# Patient Record
Sex: Male | Born: 1955 | Race: White | Marital: Married | Smoking: Former smoker
Health system: Southern US, Community
[De-identification: ages and names within clinical notes are randomized; demographics above are authoritative.]

## PROBLEM LIST (undated history)

## (undated) DIAGNOSIS — E785 Hyperlipidemia, unspecified: Secondary | ICD-10-CM

## (undated) DIAGNOSIS — G4730 Sleep apnea, unspecified: Secondary | ICD-10-CM

## (undated) DIAGNOSIS — K219 Gastro-esophageal reflux disease without esophagitis: Secondary | ICD-10-CM

## (undated) DIAGNOSIS — I10 Essential (primary) hypertension: Secondary | ICD-10-CM

## (undated) DIAGNOSIS — M1990 Unspecified osteoarthritis, unspecified site: Secondary | ICD-10-CM

## (undated) DIAGNOSIS — N179 Acute kidney failure, unspecified: Secondary | ICD-10-CM

## (undated) HISTORY — DX: Essential (primary) hypertension: I10

## (undated) HISTORY — DX: Unspecified osteoarthritis, unspecified site: M19.90

## (undated) HISTORY — DX: Hyperlipidemia, unspecified: E78.5

## (undated) HISTORY — DX: Sleep apnea, unspecified: G47.30

## (undated) HISTORY — DX: Gastro-esophageal reflux disease without esophagitis: K21.9

---
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
## 2016-05-13 DIAGNOSIS — H25093 Other age-related incipient cataract, bilateral: Secondary | ICD-10-CM | POA: Insufficient documentation

## 2016-06-06 DIAGNOSIS — M19041 Primary osteoarthritis, right hand: Secondary | ICD-10-CM | POA: Insufficient documentation

## 2016-06-06 DIAGNOSIS — M19042 Primary osteoarthritis, left hand: Secondary | ICD-10-CM

## 2016-11-03 DIAGNOSIS — L820 Inflamed seborrheic keratosis: Secondary | ICD-10-CM | POA: Diagnosis not present

## 2016-11-03 DIAGNOSIS — L905 Scar conditions and fibrosis of skin: Secondary | ICD-10-CM | POA: Diagnosis not present

## 2016-11-03 DIAGNOSIS — L821 Other seborrheic keratosis: Secondary | ICD-10-CM | POA: Diagnosis not present

## 2016-11-10 DIAGNOSIS — R05 Cough: Secondary | ICD-10-CM | POA: Diagnosis not present

## 2016-11-13 DIAGNOSIS — E785 Hyperlipidemia, unspecified: Secondary | ICD-10-CM | POA: Diagnosis not present

## 2016-11-13 DIAGNOSIS — K210 Gastro-esophageal reflux disease with esophagitis: Secondary | ICD-10-CM | POA: Diagnosis not present

## 2016-11-13 DIAGNOSIS — I10 Essential (primary) hypertension: Secondary | ICD-10-CM | POA: Diagnosis not present

## 2016-11-13 DIAGNOSIS — Z8042 Family history of malignant neoplasm of prostate: Secondary | ICD-10-CM | POA: Diagnosis not present

## 2016-11-21 DIAGNOSIS — Z6379 Other stressful life events affecting family and household: Secondary | ICD-10-CM | POA: Diagnosis not present

## 2016-11-21 DIAGNOSIS — F4323 Adjustment disorder with mixed anxiety and depressed mood: Secondary | ICD-10-CM | POA: Diagnosis not present

## 2016-12-10 DIAGNOSIS — J111 Influenza due to unidentified influenza virus with other respiratory manifestations: Secondary | ICD-10-CM | POA: Diagnosis not present

## 2016-12-23 DIAGNOSIS — R05 Cough: Secondary | ICD-10-CM | POA: Diagnosis not present

## 2016-12-23 DIAGNOSIS — G894 Chronic pain syndrome: Secondary | ICD-10-CM | POA: Diagnosis not present

## 2017-03-03 DIAGNOSIS — I7090 Unspecified atherosclerosis: Secondary | ICD-10-CM | POA: Diagnosis not present

## 2017-03-03 DIAGNOSIS — F1120 Opioid dependence, uncomplicated: Secondary | ICD-10-CM | POA: Diagnosis not present

## 2017-03-03 DIAGNOSIS — Z79899 Other long term (current) drug therapy: Secondary | ICD-10-CM | POA: Diagnosis not present

## 2017-03-03 DIAGNOSIS — K219 Gastro-esophageal reflux disease without esophagitis: Secondary | ICD-10-CM | POA: Diagnosis not present

## 2017-03-03 DIAGNOSIS — I10 Essential (primary) hypertension: Secondary | ICD-10-CM | POA: Diagnosis not present

## 2017-03-03 DIAGNOSIS — E7800 Pure hypercholesterolemia, unspecified: Secondary | ICD-10-CM | POA: Diagnosis not present

## 2017-03-03 DIAGNOSIS — Z0001 Encounter for general adult medical examination with abnormal findings: Secondary | ICD-10-CM | POA: Diagnosis not present

## 2017-03-03 DIAGNOSIS — G4733 Obstructive sleep apnea (adult) (pediatric): Secondary | ICD-10-CM | POA: Diagnosis not present

## 2017-06-03 DIAGNOSIS — G4730 Sleep apnea, unspecified: Secondary | ICD-10-CM | POA: Diagnosis not present

## 2017-06-09 DIAGNOSIS — F1120 Opioid dependence, uncomplicated: Secondary | ICD-10-CM | POA: Diagnosis not present

## 2017-06-09 DIAGNOSIS — G894 Chronic pain syndrome: Secondary | ICD-10-CM | POA: Diagnosis not present

## 2017-06-09 DIAGNOSIS — M5136 Other intervertebral disc degeneration, lumbar region: Secondary | ICD-10-CM | POA: Diagnosis not present

## 2017-06-09 DIAGNOSIS — R5383 Other fatigue: Secondary | ICD-10-CM | POA: Diagnosis not present

## 2017-06-10 DIAGNOSIS — M791 Myalgia: Secondary | ICD-10-CM | POA: Diagnosis not present

## 2017-06-10 DIAGNOSIS — H53143 Visual discomfort, bilateral: Secondary | ICD-10-CM | POA: Diagnosis not present

## 2017-06-10 DIAGNOSIS — H538 Other visual disturbances: Secondary | ICD-10-CM | POA: Diagnosis not present

## 2017-06-10 DIAGNOSIS — Z79899 Other long term (current) drug therapy: Secondary | ICD-10-CM | POA: Diagnosis not present

## 2017-06-10 DIAGNOSIS — Z7982 Long term (current) use of aspirin: Secondary | ICD-10-CM | POA: Diagnosis not present

## 2017-06-10 DIAGNOSIS — H53149 Visual discomfort, unspecified: Secondary | ICD-10-CM | POA: Diagnosis not present

## 2017-06-10 DIAGNOSIS — R011 Cardiac murmur, unspecified: Secondary | ICD-10-CM | POA: Diagnosis not present

## 2017-06-10 DIAGNOSIS — R51 Headache: Secondary | ICD-10-CM | POA: Diagnosis not present

## 2017-06-10 DIAGNOSIS — J069 Acute upper respiratory infection, unspecified: Secondary | ICD-10-CM | POA: Diagnosis not present

## 2017-06-10 DIAGNOSIS — Z87891 Personal history of nicotine dependence: Secondary | ICD-10-CM | POA: Diagnosis not present

## 2017-06-10 DIAGNOSIS — R5383 Other fatigue: Secondary | ICD-10-CM | POA: Diagnosis not present

## 2017-06-10 DIAGNOSIS — M542 Cervicalgia: Secondary | ICD-10-CM | POA: Diagnosis not present

## 2017-08-31 DIAGNOSIS — M12571 Traumatic arthropathy, right ankle and foot: Secondary | ICD-10-CM | POA: Diagnosis not present

## 2017-09-18 DIAGNOSIS — M25571 Pain in right ankle and joints of right foot: Secondary | ICD-10-CM | POA: Diagnosis not present

## 2017-10-02 DIAGNOSIS — M12571 Traumatic arthropathy, right ankle and foot: Secondary | ICD-10-CM | POA: Diagnosis not present

## 2017-10-19 DIAGNOSIS — M12571 Traumatic arthropathy, right ankle and foot: Secondary | ICD-10-CM | POA: Diagnosis not present

## 2017-10-26 DIAGNOSIS — I10 Essential (primary) hypertension: Secondary | ICD-10-CM | POA: Diagnosis not present

## 2017-10-26 DIAGNOSIS — E782 Mixed hyperlipidemia: Secondary | ICD-10-CM | POA: Diagnosis not present

## 2017-10-26 DIAGNOSIS — M79609 Pain in unspecified limb: Secondary | ICD-10-CM | POA: Diagnosis not present

## 2017-11-12 DIAGNOSIS — M12571 Traumatic arthropathy, right ankle and foot: Secondary | ICD-10-CM | POA: Diagnosis not present

## 2017-11-21 DIAGNOSIS — J069 Acute upper respiratory infection, unspecified: Secondary | ICD-10-CM | POA: Diagnosis not present

## 2017-11-21 DIAGNOSIS — R05 Cough: Secondary | ICD-10-CM | POA: Diagnosis not present

## 2017-11-23 DIAGNOSIS — Z79891 Long term (current) use of opiate analgesic: Secondary | ICD-10-CM | POA: Diagnosis not present

## 2017-11-23 DIAGNOSIS — M79673 Pain in unspecified foot: Secondary | ICD-10-CM | POA: Diagnosis not present

## 2017-11-30 DIAGNOSIS — M12571 Traumatic arthropathy, right ankle and foot: Secondary | ICD-10-CM | POA: Diagnosis not present

## 2017-11-30 DIAGNOSIS — Z01818 Encounter for other preprocedural examination: Secondary | ICD-10-CM | POA: Diagnosis not present

## 2017-12-16 DIAGNOSIS — M24571 Contracture, right ankle: Secondary | ICD-10-CM | POA: Diagnosis not present

## 2017-12-16 DIAGNOSIS — T8484XA Pain due to internal orthopedic prosthetic devices, implants and grafts, initial encounter: Secondary | ICD-10-CM | POA: Diagnosis not present

## 2017-12-16 DIAGNOSIS — G8918 Other acute postprocedural pain: Secondary | ICD-10-CM | POA: Diagnosis not present

## 2017-12-16 DIAGNOSIS — M19071 Primary osteoarthritis, right ankle and foot: Secondary | ICD-10-CM | POA: Diagnosis not present

## 2017-12-16 DIAGNOSIS — E785 Hyperlipidemia, unspecified: Secondary | ICD-10-CM | POA: Diagnosis not present

## 2017-12-16 DIAGNOSIS — Z9689 Presence of other specified functional implants: Secondary | ICD-10-CM | POA: Diagnosis not present

## 2017-12-16 DIAGNOSIS — I10 Essential (primary) hypertension: Secondary | ICD-10-CM | POA: Diagnosis not present

## 2017-12-16 DIAGNOSIS — M25571 Pain in right ankle and joints of right foot: Secondary | ICD-10-CM | POA: Diagnosis not present

## 2017-12-16 DIAGNOSIS — M12571 Traumatic arthropathy, right ankle and foot: Secondary | ICD-10-CM | POA: Diagnosis not present

## 2017-12-17 DIAGNOSIS — M24571 Contracture, right ankle: Secondary | ICD-10-CM | POA: Diagnosis not present

## 2017-12-17 DIAGNOSIS — Z9689 Presence of other specified functional implants: Secondary | ICD-10-CM | POA: Diagnosis not present

## 2017-12-17 DIAGNOSIS — M19071 Primary osteoarthritis, right ankle and foot: Secondary | ICD-10-CM | POA: Diagnosis not present

## 2017-12-31 DIAGNOSIS — T8484XA Pain due to internal orthopedic prosthetic devices, implants and grafts, initial encounter: Secondary | ICD-10-CM | POA: Diagnosis not present

## 2017-12-31 DIAGNOSIS — M12571 Traumatic arthropathy, right ankle and foot: Secondary | ICD-10-CM | POA: Diagnosis not present

## 2017-12-31 DIAGNOSIS — M24571 Contracture, right ankle: Secondary | ICD-10-CM | POA: Diagnosis not present

## 2018-01-14 DIAGNOSIS — M24571 Contracture, right ankle: Secondary | ICD-10-CM | POA: Diagnosis not present

## 2018-01-14 DIAGNOSIS — M12571 Traumatic arthropathy, right ankle and foot: Secondary | ICD-10-CM | POA: Diagnosis not present

## 2018-02-01 DIAGNOSIS — D2230 Melanocytic nevi of unspecified part of face: Secondary | ICD-10-CM | POA: Diagnosis not present

## 2018-02-01 DIAGNOSIS — K219 Gastro-esophageal reflux disease without esophagitis: Secondary | ICD-10-CM | POA: Diagnosis not present

## 2018-02-01 DIAGNOSIS — G4700 Insomnia, unspecified: Secondary | ICD-10-CM | POA: Diagnosis not present

## 2018-02-01 DIAGNOSIS — L570 Actinic keratosis: Secondary | ICD-10-CM | POA: Diagnosis not present

## 2018-02-01 DIAGNOSIS — D224 Melanocytic nevi of scalp and neck: Secondary | ICD-10-CM | POA: Diagnosis not present

## 2018-02-01 DIAGNOSIS — I10 Essential (primary) hypertension: Secondary | ICD-10-CM | POA: Diagnosis not present

## 2018-02-01 DIAGNOSIS — D225 Melanocytic nevi of trunk: Secondary | ICD-10-CM | POA: Diagnosis not present

## 2018-02-11 DIAGNOSIS — M24571 Contracture, right ankle: Secondary | ICD-10-CM | POA: Diagnosis not present

## 2018-02-11 DIAGNOSIS — M5416 Radiculopathy, lumbar region: Secondary | ICD-10-CM | POA: Diagnosis not present

## 2018-02-11 DIAGNOSIS — M12571 Traumatic arthropathy, right ankle and foot: Secondary | ICD-10-CM | POA: Diagnosis not present

## 2018-02-12 DIAGNOSIS — M25671 Stiffness of right ankle, not elsewhere classified: Secondary | ICD-10-CM | POA: Diagnosis not present

## 2018-02-12 DIAGNOSIS — M12571 Traumatic arthropathy, right ankle and foot: Secondary | ICD-10-CM | POA: Diagnosis not present

## 2018-02-15 DIAGNOSIS — Z96661 Presence of right artificial ankle joint: Secondary | ICD-10-CM | POA: Diagnosis not present

## 2018-02-15 DIAGNOSIS — M12571 Traumatic arthropathy, right ankle and foot: Secondary | ICD-10-CM | POA: Diagnosis not present

## 2018-02-18 DIAGNOSIS — M12571 Traumatic arthropathy, right ankle and foot: Secondary | ICD-10-CM | POA: Diagnosis not present

## 2018-02-18 DIAGNOSIS — R600 Localized edema: Secondary | ICD-10-CM | POA: Diagnosis not present

## 2018-02-18 DIAGNOSIS — M25671 Stiffness of right ankle, not elsewhere classified: Secondary | ICD-10-CM | POA: Diagnosis not present

## 2018-02-23 DIAGNOSIS — M12571 Traumatic arthropathy, right ankle and foot: Secondary | ICD-10-CM | POA: Diagnosis not present

## 2018-02-23 DIAGNOSIS — M25671 Stiffness of right ankle, not elsewhere classified: Secondary | ICD-10-CM | POA: Diagnosis not present

## 2018-02-23 DIAGNOSIS — R600 Localized edema: Secondary | ICD-10-CM | POA: Diagnosis not present

## 2018-02-25 DIAGNOSIS — M12571 Traumatic arthropathy, right ankle and foot: Secondary | ICD-10-CM | POA: Diagnosis not present

## 2018-02-25 DIAGNOSIS — M25671 Stiffness of right ankle, not elsewhere classified: Secondary | ICD-10-CM | POA: Diagnosis not present

## 2018-02-26 DIAGNOSIS — M25671 Stiffness of right ankle, not elsewhere classified: Secondary | ICD-10-CM | POA: Diagnosis not present

## 2018-02-26 DIAGNOSIS — R600 Localized edema: Secondary | ICD-10-CM | POA: Diagnosis not present

## 2018-02-26 DIAGNOSIS — M12571 Traumatic arthropathy, right ankle and foot: Secondary | ICD-10-CM | POA: Diagnosis not present

## 2018-03-01 DIAGNOSIS — M25671 Stiffness of right ankle, not elsewhere classified: Secondary | ICD-10-CM | POA: Diagnosis not present

## 2018-03-01 DIAGNOSIS — M12571 Traumatic arthropathy, right ankle and foot: Secondary | ICD-10-CM | POA: Diagnosis not present

## 2018-03-02 DIAGNOSIS — M12571 Traumatic arthropathy, right ankle and foot: Secondary | ICD-10-CM | POA: Diagnosis not present

## 2018-03-02 DIAGNOSIS — R600 Localized edema: Secondary | ICD-10-CM | POA: Diagnosis not present

## 2018-03-02 DIAGNOSIS — M25671 Stiffness of right ankle, not elsewhere classified: Secondary | ICD-10-CM | POA: Diagnosis not present

## 2018-03-04 DIAGNOSIS — M12571 Traumatic arthropathy, right ankle and foot: Secondary | ICD-10-CM | POA: Diagnosis not present

## 2018-03-04 DIAGNOSIS — M25671 Stiffness of right ankle, not elsewhere classified: Secondary | ICD-10-CM | POA: Diagnosis not present

## 2018-03-09 DIAGNOSIS — M12571 Traumatic arthropathy, right ankle and foot: Secondary | ICD-10-CM | POA: Diagnosis not present

## 2018-03-09 DIAGNOSIS — M25671 Stiffness of right ankle, not elsewhere classified: Secondary | ICD-10-CM | POA: Diagnosis not present

## 2018-03-13 HISTORY — PX: TOTAL ANKLE REPLACEMENT: SUR1218

## 2018-03-31 DIAGNOSIS — I10 Essential (primary) hypertension: Secondary | ICD-10-CM | POA: Diagnosis not present

## 2018-03-31 DIAGNOSIS — E782 Mixed hyperlipidemia: Secondary | ICD-10-CM | POA: Diagnosis not present

## 2018-03-31 DIAGNOSIS — G4700 Insomnia, unspecified: Secondary | ICD-10-CM | POA: Diagnosis not present

## 2018-04-13 DIAGNOSIS — M12571 Traumatic arthropathy, right ankle and foot: Secondary | ICD-10-CM | POA: Diagnosis not present

## 2018-04-13 DIAGNOSIS — M25671 Stiffness of right ankle, not elsewhere classified: Secondary | ICD-10-CM | POA: Diagnosis not present

## 2018-04-16 ENCOUNTER — Encounter: Payer: Self-pay | Admitting: Internal Medicine

## 2018-04-20 ENCOUNTER — Other Ambulatory Visit: Payer: Self-pay

## 2018-04-20 ENCOUNTER — Ambulatory Visit: Payer: BLUE CROSS/BLUE SHIELD | Admitting: Internal Medicine

## 2018-04-20 ENCOUNTER — Encounter: Payer: Self-pay | Admitting: Internal Medicine

## 2018-04-20 VITALS — BP 122/80 | HR 65 | Ht 70.0 in | Wt 247.0 lb

## 2018-04-20 DIAGNOSIS — G4733 Obstructive sleep apnea (adult) (pediatric): Secondary | ICD-10-CM | POA: Insufficient documentation

## 2018-04-20 DIAGNOSIS — Z86010 Personal history of colonic polyps: Secondary | ICD-10-CM | POA: Insufficient documentation

## 2018-04-20 DIAGNOSIS — E785 Hyperlipidemia, unspecified: Secondary | ICD-10-CM | POA: Insufficient documentation

## 2018-04-20 DIAGNOSIS — E782 Mixed hyperlipidemia: Secondary | ICD-10-CM | POA: Diagnosis not present

## 2018-04-20 DIAGNOSIS — I10 Essential (primary) hypertension: Secondary | ICD-10-CM | POA: Diagnosis not present

## 2018-04-20 DIAGNOSIS — M5030 Other cervical disc degeneration, unspecified cervical region: Secondary | ICD-10-CM | POA: Diagnosis not present

## 2018-04-20 DIAGNOSIS — Z9989 Dependence on other enabling machines and devices: Secondary | ICD-10-CM

## 2018-04-20 MED ORDER — ATORVASTATIN CALCIUM 20 MG PO TABS: 20.0000 mg | ORAL_TABLET | Freq: Every day | ORAL | 1 refills | Status: DC

## 2018-04-20 MED ORDER — LISINOPRIL 40 MG PO TABS: 40.0000 mg | ORAL_TABLET | Freq: Every day | ORAL | 1 refills | Status: DC

## 2018-04-20 MED ORDER — AMLODIPINE BESYLATE 5 MG PO TABS: 5.0000 mg | ORAL_TABLET | Freq: Every day | ORAL | 1 refills | Status: DC

## 2018-04-20 NOTE — Progress Notes (Signed)
Date:  04/20/2018   Name:  Chad Choi   DOB:  January 22, 1956   MRN:  [id number]   Chief Complaint: New Patient (Initial Visit) ([facility] Masonhil[facility]uri for meds.--seen 3 weeks ago.) Hypertension  This is a chronic problem. The problem is unchanged. The problem is controlled. Pertinent negatives include no chest pain, headaches, palpitations or shortness of breath.  Hyperlipidemia  This is a chronic problem. The problem is controlled. Associated symptoms include myalgias. Pertinent negatives include no chest pain or shortness of breath. Current antihyperlipidemic treatment includes statins.   Ankle pain - he has a service related ankle injury that was no longer responding to cortisone injections.  [facility]  He had Ankle fusion in March by [facility] and is doing well.  Lat week he had some brief chest pressure while driving lasting 15 minutes.  He was under emotional stress at the time and does not think it was angina.  He chewed 2 aspirins and pain resolved.  It has not recurred. He had a Myoview stress test in 06/2016 that was normal. He no longer smokes.  He has been having his PCP care by Dr. Shanda Howellshilikuri.   [facility].  He was there for the ankle pain.  He does not get physicals from them.  [facility] prescribes flexeril and gabapentin.  Review of Systems  Constitutional: Negative for chills, fatigue and fever.  Respiratory: Negative for cough, chest tightness, shortness of breath and wheezing.   Cardiovascular: Positive for leg swelling. Negative for chest pain and palpitations.  Gastrointestinal: Negative for blood in stool, constipation and diarrhea.  Musculoskeletal: Positive for arthralgias, back pain and myalgias.  Skin: Negative for rash.  Neurological: Negative for dizziness and headaches.  Psychiatric/Behavioral: Negative for dysphoric mood and sleep disturbance. The patient is not nervous/anxious.      Patient Active Problem List   Diagnosis Date Noted  . Degeneration of intervertebral disc of cervical region 04/20/2018  . Hyperlipidemia 04/20/2018  . Essential hypertension 04/20/2018  . Primary osteoarthritis of both hands 06/06/2016  . Other age-related incipient cataract, bilateral 05/13/2016    Prior to Admission medications   Medication Sig Start Date End Date Taking? Authorizing Provider  amLODipine (NORVASC) 5 MG tablet  10/18/13  Yes [provider]  atorvastatin (LIPITOR) 20 MG tablet  03/12/16  Yes [provider]  cyclobenzaprine (FLEXERIL) 10 MG tablet Take 10 mg by mouth 3 (three) times daily as needed for muscle spasms.   Yes [provider]  gabapentin (NEURONTIN) 300 MG capsule Take 300 mg by mouth 3 (three) times daily.   Yes [provider]  ibuprofen (ADVIL,MOTRIN) 600 MG tablet Take 600 mg by mouth every 6 (six) hours as needed.   Yes [provider]  lisinopril (PRINIVIL,ZESTRIL) 40 MG tablet  10/30/13  Yes [provider]  OxyCODONE HCl, Abuse Deter, (OXAYDO) 5 MG TABA Take 1 tablet by mouth 4 (four) times daily.   Yes [provider]    Allergies  Allergen Reactions  . Naproxen Hives  . Sulfa Antibiotics Hives and Other (See Comments)    Past Surgical History:  Procedure Laterality Date  . TOTAL ANKLE REPLACEMENT      Social History   Tobacco Use  . Smoking status: Former Games developermoker  . Smokeless tobacco: Former NeurosurgeonUser    Quit date: 1989  Substance Use Topics  . Alcohol use: Never  Frequency: Never  . Drug use: Never     Medication list has been reviewed and updated.  Current Meds  Medication Sig  . amLODipine (NORVASC) 5 MG tablet   . atorvastatin (LIPITOR) 20 MG tablet   . cyclobenzaprine (FLEXERIL) 10 MG tablet Take 10 mg by mouth 3 (three) times daily as needed for muscle spasms.  Marland Kitchen gabapentin (NEURONTIN) 300 MG capsule Take 300 mg by mouth 3 (three) times daily.  Marland Kitchen ibuprofen  (ADVIL,MOTRIN) 600 MG tablet Take 600 mg by mouth every 6 (six) hours as needed.  Marland Kitchen lisinopril (PRINIVIL,ZESTRIL) 40 MG tablet   . OxyCODONE HCl, Abuse Deter, (OXAYDO) 5 MG TABA Take 1 tablet by mouth 4 (four) times daily.    PHQ 2/9 Scores 04/20/2018  PHQ - 2 Score 0    Physical Exam  Constitutional: He is oriented to person, place, and time. He appears well-developed. No distress.  HENT:  Head: Normocephalic and atraumatic.  Neck: Normal range of motion. Neck supple.  Cardiovascular: Normal rate, regular rhythm and normal heart sounds. Exam reveals no friction rub.  No murmur heard. Pulmonary/Chest: Effort normal and breath sounds normal. No stridor. No respiratory distress. He has no wheezes.  Abdominal: Soft. Bowel sounds are normal.  Musculoskeletal: Normal range of motion. He exhibits edema (in right ankle since surgery).  Lymphadenopathy:    He has no cervical adenopathy.  Neurological: He is alert and oriented to person, place, and time.  Skin: Skin is warm and dry. No rash noted.  Psychiatric: He has a normal mood and affect. His behavior is normal. Thought content normal.  Nursing note and vitals reviewed.   BP 122/80   Pulse 65   Ht 5\' 10"  (1.778 m)   Wt 247 lb (112 kg)   SpO2 97%   BMI 35.44 kg/m   Assessment and Plan: 1. Mixed hyperlipidemia Resume statin therapy - atorvastatin (LIPITOR) 20 MG tablet; Take 1 tablet (20 mg total) by mouth daily at 6 PM.  Dispense: 90 tablet; Refill: 1  2. Essential hypertension Controlled If chest pain recurs, go to ED immediately - amLODipine (NORVASC) 5 MG tablet; Take 1 tablet (5 mg total) by mouth daily.  Dispense: 90 tablet; Refill: 1 - lisinopril (PRINIVIL,ZESTRIL) 40 MG tablet; Take 1 tablet (40 mg total) by mouth daily.  Dispense: 90 tablet; Refill: 1  3. Degeneration of intervertebral disc of cervical region Continue meds from pain management Also prescribing ambien CR  4. OSA on CPAP Continue nightly  5. Hx of  adenomatous colonic polyps Schedule 3 yr repeat with Dr. Earlean Polka   Meds ordered this encounter  Medications  . amLODipine (NORVASC) 5 MG tablet    Sig: Take 1 tablet (5 mg total) by mouth daily.    Dispense:  90 tablet    Refill:  1  . atorvastatin (LIPITOR) 20 MG tablet    Sig: Take 1 tablet (20 mg total) by mouth daily at 6 PM.    Dispense:  90 tablet    Refill:  1  . lisinopril (PRINIVIL,ZESTRIL) 40 MG tablet    Sig: Take 1 tablet (40 mg total) by mouth daily.    Dispense:  90 tablet    Refill:  1    Partially dictated using Animal nutritionist. Any errors are unintentional.  Bari Edward, MD [facility]  04/20/2018

## 2018-04-20 NOTE — Patient Instructions (Signed)
Schedule Colonoscopy  [facility] and [facility] them to me.

## 2018-04-21 DIAGNOSIS — M12571 Traumatic arthropathy, right ankle and foot: Secondary | ICD-10-CM | POA: Diagnosis not present

## 2018-04-29 DIAGNOSIS — M12571 Traumatic arthropathy, right ankle and foot: Secondary | ICD-10-CM | POA: Diagnosis not present

## 2018-04-29 DIAGNOSIS — R600 Localized edema: Secondary | ICD-10-CM | POA: Diagnosis not present

## 2018-06-07 DIAGNOSIS — G4733 Obstructive sleep apnea (adult) (pediatric): Secondary | ICD-10-CM | POA: Diagnosis not present

## 2018-06-15 DIAGNOSIS — M545 Low back pain: Secondary | ICD-10-CM | POA: Diagnosis not present

## 2018-06-26 DIAGNOSIS — M545 Low back pain: Secondary | ICD-10-CM | POA: Diagnosis not present

## 2018-06-29 DIAGNOSIS — G5730 Lesion of lateral popliteal nerve, unspecified lower limb: Secondary | ICD-10-CM | POA: Diagnosis not present

## 2018-07-02 DIAGNOSIS — G4733 Obstructive sleep apnea (adult) (pediatric): Secondary | ICD-10-CM | POA: Diagnosis not present

## 2018-07-05 DIAGNOSIS — Z9889 Other specified postprocedural states: Secondary | ICD-10-CM | POA: Insufficient documentation

## 2018-07-05 DIAGNOSIS — Z96661 Presence of right artificial ankle joint: Secondary | ICD-10-CM | POA: Diagnosis not present

## 2018-07-05 DIAGNOSIS — M25671 Stiffness of right ankle, not elsewhere classified: Secondary | ICD-10-CM | POA: Diagnosis not present

## 2018-07-07 ENCOUNTER — Encounter: Payer: Self-pay | Admitting: Internal Medicine

## 2018-09-07 ENCOUNTER — Encounter: Payer: Self-pay | Admitting: Internal Medicine

## 2018-09-07 ENCOUNTER — Ambulatory Visit (INDEPENDENT_AMBULATORY_CARE_PROVIDER_SITE_OTHER): Payer: BLUE CROSS/BLUE SHIELD | Admitting: Internal Medicine

## 2018-09-07 VITALS — BP 116/64 | HR 56 | Ht 70.0 in | Wt 257.0 lb

## 2018-09-07 DIAGNOSIS — Z23 Encounter for immunization: Secondary | ICD-10-CM

## 2018-09-07 DIAGNOSIS — G4733 Obstructive sleep apnea (adult) (pediatric): Secondary | ICD-10-CM | POA: Diagnosis not present

## 2018-09-07 DIAGNOSIS — Z86010 Personal history of colonic polyps: Secondary | ICD-10-CM

## 2018-09-07 DIAGNOSIS — E782 Mixed hyperlipidemia: Secondary | ICD-10-CM

## 2018-09-07 DIAGNOSIS — Z1159 Encounter for screening for other viral diseases: Secondary | ICD-10-CM | POA: Diagnosis not present

## 2018-09-07 DIAGNOSIS — Z125 Encounter for screening for malignant neoplasm of prostate: Secondary | ICD-10-CM

## 2018-09-07 DIAGNOSIS — Z0000 Encounter for general adult medical examination without abnormal findings: Secondary | ICD-10-CM | POA: Diagnosis not present

## 2018-09-07 DIAGNOSIS — Z9989 Dependence on other enabling machines and devices: Secondary | ICD-10-CM

## 2018-09-07 DIAGNOSIS — I10 Essential (primary) hypertension: Secondary | ICD-10-CM | POA: Diagnosis not present

## 2018-09-07 DIAGNOSIS — Z860101 Personal history of adenomatous and serrated colon polyps: Secondary | ICD-10-CM

## 2018-09-07 LAB — POCT URINALYSIS DIPSTICK
BILIRUBIN UA: NEGATIVE
Blood, UA: NEGATIVE
Glucose, UA: NEGATIVE
KETONES UA: NEGATIVE
Leukocytes, UA: NEGATIVE
Nitrite, UA: NEGATIVE
Protein, UA: NEGATIVE
Spec Grav, UA: 1.01 (ref 1.010–1.025)
UROBILINOGEN UA: 0.2 U/dL
pH, UA: 6.5 (ref 5.0–8.0)

## 2018-09-07 MED ORDER — ATORVASTATIN CALCIUM 20 MG PO TABS: 20.0000 mg | ORAL_TABLET | Freq: Every day | ORAL | 1 refills | Status: DC

## 2018-09-07 MED ORDER — AMLODIPINE BESYLATE 5 MG PO TABS: 5.0000 mg | ORAL_TABLET | Freq: Every day | ORAL | 1 refills | Status: DC

## 2018-09-07 MED ORDER — LISINOPRIL 40 MG PO TABS: 40.0000 mg | ORAL_TABLET | Freq: Every day | ORAL | 1 refills | Status: DC

## 2018-09-07 NOTE — Progress Notes (Signed)
Date:  09/07/2018   Name:  Chad BushyDouglas Rajewski   DOB:  11-12-1955   MRN:  [id number]   Chief Complaint: Annual Exam (Wants to know if we will send in Ibuprofen 600 mg tabs. [facility]. ) Chad Choi is a 62 y.o. male who presents today for his Complete Annual Exam. He feels fairly well. He reports exercising some - mostly walking 2-3 miles per day. He reports he is sleeping fairly well. He had his right ankle replaced this summer and is doing well.  Hypertension  Pertinent negatives include no chest pain, headaches, palpitations or shortness of breath.  Hyperlipidemia  Pertinent negatives include no chest pain, myalgias or shortness of breath.  Back Pain  Pertinent negatives include no abdominal pain, chest pain, dysuria or headaches.  OSA - on CPAP nightly with good sleep and no daytime somnolence or morning headache.  Review of Systems  Constitutional: Negative for appetite change, chills, diaphoresis, fatigue and unexpected weight change.  HENT: Negative for hearing loss, tinnitus, trouble swallowing and voice change.   Eyes: Negative for visual disturbance.  Respiratory: Negative for choking, shortness of breath and wheezing.   Cardiovascular: Negative for chest pain, palpitations and leg swelling.  Gastrointestinal: Negative for abdominal pain, blood in stool, constipation and diarrhea.       Gerd at times  Genitourinary: Negative for difficulty urinating, dysuria, frequency and hematuria.  Musculoskeletal: Negative for arthralgias, back pain and myalgias.  Skin: Negative for color change and rash.  Allergic/Immunologic: Negative for environmental allergies.  Neurological: Negative for dizziness, syncope and headaches.  Hematological: Negative for adenopathy.  Psychiatric/Behavioral: Negative for dysphoric mood and sleep disturbance.    Patient Active Problem List   Diagnosis Date Noted  . Severe obesity (HCC) 09/07/2018  . History of arthroplasty of right  ankle 07/05/2018  . Degeneration of intervertebral disc of cervical region 04/20/2018  . Hyperlipidemia 04/20/2018  . Essential hypertension 04/20/2018  . Hx of adenomatous colonic polyps 04/20/2018  . OSA on CPAP 04/20/2018  . Primary osteoarthritis of both hands 06/06/2016  . Other age-related incipient cataract, bilateral 05/13/2016    Allergies  Allergen Reactions  . Naproxen Hives  . Sulfa Antibiotics Hives and Other (See Comments)    Past Surgical History:  Procedure Laterality Date  . TOTAL ANKLE REPLACEMENT Right 03/2018    Social History   Tobacco Use  . Smoking status: Former Smoker    Packs/day: 1.50    Years: 16.00    Pack years: 24.00    Types: Cigarettes    Last attempt to quit: 03/28/1987    Years since quitting: 31.4  . Smokeless tobacco: Former NeurosurgeonUser    Quit date: 1989  Substance Use Topics  . Alcohol use: Never    Frequency: Never  . Drug use: Never     Medication list has been reviewed and updated.  Current Meds  Medication Sig  . amLODipine (NORVASC) 5 MG tablet Take 1 tablet (5 mg total) by mouth daily.  Marland Kitchen. atorvastatin (LIPITOR) 20 MG tablet Take 1 tablet (20 mg total) by mouth daily at 6 PM.  . cyclobenzaprine (FLEXERIL) 10 MG tablet Take 10 mg by mouth 3 (three) times daily as needed for muscle spasms. [facility]  . ibuprofen (ADVIL,MOTRIN) 600 MG tablet Take 600 mg by mouth every 6 (six) hours as needed.  Marland Kitchen. lisinopril (PRINIVIL,ZESTRIL) 40 MG tablet Take 1 tablet (40 mg total) by mouth daily.  . NON FORMULARY CPAP nightly  .  OxyCODONE HCl, Abuse Deter, (OXAYDO) 5 MG TABA Take 1 tablet by mouth 4 (four) times daily.  Marland Kitchen zolpidem (AMBIEN CR) 12.5 MG CR tablet TAKE 1 TAB BY MOUTH AT BEDTIME AS NEEDED  . [DISCONTINUED] amLODipine (NORVASC) 5 MG tablet Take 1 tablet (5 mg total) by mouth daily.  . [DISCONTINUED] atorvastatin (LIPITOR) 20 MG tablet Take 1 tablet (20 mg total) by mouth daily at 6 PM.  . [DISCONTINUED] lisinopril  (PRINIVIL,ZESTRIL) 40 MG tablet Take 1 tablet (40 mg total) by mouth daily.    PHQ 2/9 Scores 09/07/2018 04/20/2018  PHQ - 2 Score 0 0    Physical Exam  Constitutional: He is oriented to person, place, and time. He appears well-developed and well-nourished.  HENT:  Head: Normocephalic.  Right Ear: Tympanic membrane, external ear and ear canal normal.  Left Ear: Tympanic membrane, external ear and ear canal normal.  Nose: Nose normal.  Mouth/Throat: Uvula is midline and oropharynx is clear and moist.  Eyes: Pupils are equal, round, and reactive to light. Conjunctivae and EOM are normal.  Neck: Normal range of motion. Neck supple. Carotid bruit is not present. No thyromegaly present.  Cardiovascular: Normal rate, regular rhythm, normal heart sounds and intact distal pulses.  Pulmonary/Chest: Effort normal and breath sounds normal. He has no wheezes. Right breast exhibits no mass. Left breast exhibits no mass.  Abdominal: Soft. Normal appearance and bowel sounds are normal. There is no hepatosplenomegaly. There is no tenderness.  Musculoskeletal: Normal range of motion.  Lymphadenopathy:    He has no cervical adenopathy.  Neurological: He is alert and oriented to person, place, and time. He has normal reflexes.  Skin: Skin is warm, dry and intact.  Psychiatric: He has a normal mood and affect. His speech is normal and behavior is normal. Judgment and thought content normal.  Nursing note and vitals reviewed.   BP 116/64 (BP Location: Right Arm, Patient Position: Sitting, Cuff Size: Large)   Pulse (!) 56   Ht 5\' 10"  (1.778 m)   Wt 257 lb (116.6 kg)   SpO2 97%   BMI 36.88 kg/m   Assessment and Plan: 1. Annual physical exam Normal exam except for weight Work on diet and exercise - POCT urinalysis dipstick  2. Colon cancer screening Pt to call GI to schedule colonoscopy  3. Prostate cancer screening DRE deferred - PSA  4. Essential hypertension controlled - amLODipine  (NORVASC) 5 MG tablet; Take 1 tablet (5 mg total) by mouth daily.  Dispense: 90 tablet; Refill: 1 - lisinopril (PRINIVIL,ZESTRIL) 40 MG tablet; Take 1 tablet (40 mg total) by mouth daily.  Dispense: 90 tablet; Refill: 1 - CBC with Differential/Platelet - Comprehensive metabolic panel  5. Mixed hyperlipidemia On statin therapy - atorvastatin (LIPITOR) 20 MG tablet; Take 1 tablet (20 mg total) by mouth daily at 6 PM.  Dispense: 90 tablet; Refill: 1 - Lipid panel  6. Hx of adenomatous colonic polyps Pt to call GI to schedule  7. OSA on CPAP Doing well with good compliance  8. BMI 36.0-36.9,adult Work on diet and weight loss  9. Need for hepatitis C screening test - Hepatitis C antibody  10. Need for diphtheria-tetanus-pertussis (Tdap) vaccine - Tdap vaccine greater than or equal to 7yo IM   Partially dictated using Animal nutritionist. Any errors are unintentional.  Bari Edward, MD [facility]  09/07/2018

## 2018-09-07 NOTE — Patient Instructions (Addendum)
Call Dr. Earlean Polka and schedule colonoscopy  Tdap Vaccine (Tetanus, Diphtheria and Pertussis): What You Need to Know 1. Why get vaccinated? Tetanus, diphtheria and pertussis are very serious diseases. Tdap vaccine can protect [place] from these diseases. And, Tdap vaccine given to pregnant women can protect newborn babies against pertussis. TETANUS (Lockjaw) is rare in the [place] today. It causes painful muscle tightening and stiffness, usually all over the body.  It can lead to tightening of muscles in the head and neck so you can't open your mouth, swallow, or sometimes even breathe. Tetanus kills about 1 out of 10 people who are infected even after receiving the best medical care.  DIPHTHERIA is also rare in the [place] today. It can cause a thick coating to form in the back of the throat.  It can lead to breathing problems, heart failure, paralysis, and death.  PERTUSSIS (Whooping Cough) causes severe coughing spells, which can cause difficulty breathing, vomiting and disturbed sleep.  It can also lead to weight loss, incontinence, and rib fractures. Up to 2 in 100 adolescents and 5 in 100 adults with pertussis are hospitalized or have complications, which could include pneumonia or death.  These diseases are caused by bacteria. Diphtheria and pertussis are spread from person to person through secretions from coughing or sneezing. Tetanus enters the body through cuts, scratches, or wounds. Before vaccines, as many as 200,000 cases of diphtheria, 200,000 cases of pertussis, and hundreds of cases of tetanus, were reported in the [place] each year. Since vaccination began, reports of cases for tetanus and diphtheria have dropped by about 99% and for pertussis by about 80%. 2. Tdap vaccine Tdap vaccine can protect adolescents and adults from tetanus, diphtheria, and pertussis. One dose of Tdap is routinely given at age 71 or 51. People who did not get Tdap at that age should get it as  soon as possible. Tdap is especially important for healthcare professionals and anyone having close contact with a baby younger than 12 months. Pregnant women should get a dose of Tdap during every pregnancy, to protect the newborn from pertussis. Infants are most at risk for severe, life-threatening complications from pertussis. Another vaccine, called Td, protects against tetanus and diphtheria, but not pertussis. A Td booster should be given every 10 years. Tdap may be given as one of these boosters if you have never gotten Tdap before. Tdap may also be given after a severe cut or burn to prevent tetanus infection. Your doctor or the person giving you the vaccine can give you more information. Tdap may safely be given at the same time as other vaccines. 3. Some people should not get this vaccine  A person who has ever had a life-threatening allergic reaction after a previous dose of any diphtheria, tetanus or pertussis containing vaccine, OR has a severe allergy to any part of this vaccine, should not get Tdap vaccine. Tell the person giving the vaccine about any severe allergies.  Anyone who had coma or long repeated seizures within 7 days after a childhood dose of DTP or DTaP, or a previous dose of Tdap, should not get Tdap, unless a cause other than the vaccine was found. They can still get Td.  Talk to your doctor if you: ? have seizures or another nervous system problem, ? had severe pain or swelling after any vaccine containing diphtheria, tetanus or pertussis, ? ever had a condition called Guillain-Barr Syndrome (GBS), ? aren't feeling well on the day the shot is  scheduled. 4. Risks With any medicine, including vaccines, there is a chance of side effects. These are usually mild and go away on their own. Serious reactions are also possible but are rare. Most people who get Tdap vaccine do not have any problems with it. Mild problems following Tdap: (Did not interfere with  activities)  Pain where the shot was given (about 3 in 4 adolescents or 2 in 3 adults)  Redness or swelling where the shot was given (about 1 person in 5)  Mild fever of at least 100.27F (up to about 1 in 25 adolescents or 1 in 100 adults)  Headache (about 3 or 4 people in 10)  Tiredness (about 1 person in 3 or 4)  Nausea, vomiting, diarrhea, stomach ache (up to 1 in 4 adolescents or 1 in 10 adults)  Chills, sore joints (about 1 person in 10)  Body aches (about 1 person in 3 or 4)  Rash, swollen glands (uncommon)  Moderate problems following Tdap: (Interfered with activities, but did not require medical attention)  Pain where the shot was given (up to 1 in 5 or 6)  Redness or swelling where the shot was given (up to about 1 in 16 adolescents or 1 in 12 adults)  Fever over 102F (about 1 in 100 adolescents or 1 in 250 adults)  Headache (about 1 in 7 adolescents or 1 in 10 adults)  Nausea, vomiting, diarrhea, stomach ache (up to 1 or 3 people in 100)  Swelling of the entire arm where the shot was given (up to about 1 in 500).  Severe problems following Tdap: (Unable to perform usual activities; required medical attention)  Swelling, severe pain, bleeding and redness in the arm where the shot was given (rare).  Problems that could happen after any vaccine:  People sometimes faint after a medical procedure, including vaccination. Sitting or lying down for about 15 minutes can help prevent fainting, and injuries caused by a fall. Tell your doctor if you feel dizzy, or have vision changes or ringing in the ears.  Some people get severe pain in the shoulder and have difficulty moving the arm where a shot was given. This happens very rarely.  Any medication can cause a severe allergic reaction. Such reactions from a vaccine are very rare, estimated at fewer than 1 in a million doses, and would happen within a few minutes to a few hours after the vaccination. As with any  medicine, there is a very remote chance of a vaccine causing a serious injury or death. The safety of vaccines is always being monitored. For more information, visit: http://floyd.org/ 5. What if there is a serious problem? What should I look for? Look for anything that concerns you, such as signs of a severe allergic reaction, very high fever, or unusual behavior. Signs of a severe allergic reaction can include hives, swelling of the face and throat, difficulty breathing, a fast heartbeat, dizziness, and weakness. These would usually start a few minutes to a few hours after the vaccination. What should I do?  If you think it is a severe allergic reaction or other emergency that can't wait, call 9-1-1 or get the person to the nearest hospital. Otherwise, call your doctor.  Afterward, the reaction should be reported to the Vaccine Adverse Event Reporting System (VAERS). Your doctor might file this report, or you can do it yourself through the VAERS web site at www.vaers.LAgents.no, or by calling 1-812-028-4981. ? VAERS does not give medical advice. 6. The  National Vaccine Injury Compensation Program The National Vaccine Injury Compensation Program (VICP) is a federal program that was created to compensate people who may have been injured by certain vaccines. Persons who believe they may have been injured by a vaccine can learn about the program and about filing a claim by calling 1-670-427-6142 or visiting the VICP website at SpiritualWord.atwww.hrsa.gov/vaccinecompensation. There is a time limit to file a claim for compensation. 7. How can I learn more?  Ask your doctor. He or she can give you the vaccine package insert or suggest other sources of information.  Call your local or state health department.  Contact the Centers for Disease Control and Prevention (CDC): ? Call (719)373-18331-937-441-0914 (1-800-CDC-INFO) or ? Visit CDC's website at PicCapture.uywww.cdc.gov/vaccines CDC Tdap Vaccine VIS (12/06/13) This information  is not intended to replace advice given to you by your health care provider. Make sure you discuss any questions you have with your health care provider. Document Released: 03/30/2012 Document Revised: 06/19/2016 Document Reviewed: 06/19/2016 Elsevier Interactive Patient Education  2017 [employer]

## 2018-09-08 LAB — CBC WITH DIFFERENTIAL/PLATELET
BASOS ABS: 0.1 10*3/uL (ref 0.0–0.2)
Basos: 1 %
EOS (ABSOLUTE): 0.4 10*3/uL (ref 0.0–0.4)
Eos: 6 %
Hematocrit: 43.5 % (ref 37.5–51.0)
Hemoglobin: 14.4 g/dL (ref 13.0–17.7)
Immature Grans (Abs): 0 10*3/uL (ref 0.0–0.1)
Immature Granulocytes: 0 %
LYMPHS ABS: 1.6 10*3/uL (ref 0.7–3.1)
Lymphs: 23 %
MCH: 27.7 pg (ref 26.6–33.0)
MCHC: 33.1 g/dL (ref 31.5–35.7)
MCV: 84 fL (ref 79–97)
MONOS ABS: 0.8 10*3/uL (ref 0.1–0.9)
Monocytes: 12 %
Neutrophils Absolute: 3.9 10*3/uL (ref 1.4–7.0)
Neutrophils: 58 %
Platelets: 271 10*3/uL (ref 150–450)
RBC: 5.2 x10E6/uL (ref 4.14–5.80)
RDW: 13 % (ref 12.3–15.4)
WBC: 6.7 10*3/uL (ref 3.4–10.8)

## 2018-09-08 LAB — COMPREHENSIVE METABOLIC PANEL
ALK PHOS: 61 IU/L (ref 39–117)
ALT: 26 IU/L (ref 0–44)
AST: 23 IU/L (ref 0–40)
Albumin/Globulin Ratio: 1.9 (ref 1.2–2.2)
Albumin: 4.3 g/dL (ref 3.6–4.8)
BUN/Creatinine Ratio: 14 (ref 10–24)
BUN: 13 mg/dL (ref 8–27)
Bilirubin Total: 0.7 mg/dL (ref 0.0–1.2)
CO2: 23 mmol/L (ref 20–29)
Calcium: 8.9 mg/dL (ref 8.6–10.2)
Chloride: 102 mmol/L (ref 96–106)
Creatinine, Ser: 0.9 mg/dL (ref 0.76–1.27)
GFR calc Af Amer: 104 mL/min/{1.73_m2} (ref >59)
GFR calc non Af Amer: 90 mL/min/{1.73_m2} (ref >59)
GLUCOSE: 76 mg/dL (ref 65–99)
Globulin, Total: 2.3 g/dL (ref 1.5–4.5)
Potassium: 4.5 mmol/L (ref 3.5–5.2)
Sodium: 141 mmol/L (ref 134–144)
Total Protein: 6.6 g/dL (ref 6.0–8.5)

## 2018-09-08 LAB — LIPID PANEL
CHOLESTEROL TOTAL: 139 mg/dL (ref 100–199)
Chol/HDL Ratio: 3.3 ratio (ref 0.0–5.0)
HDL: 42 mg/dL (ref >39)
LDL Calculated: 74 mg/dL (ref 0–99)
TRIGLYCERIDES: 114 mg/dL (ref 0–149)
VLDL CHOLESTEROL CAL: 23 mg/dL (ref 5–40)

## 2018-09-08 LAB — HEPATITIS C ANTIBODY: Hep C Virus Ab: <0.1 s/co ratio (ref 0.0–0.9)

## 2018-09-08 LAB — PSA: Prostate Specific Ag, Serum: 0.9 ng/mL (ref 0.0–4.0)

## 2018-09-13 DIAGNOSIS — Z79899 Other long term (current) drug therapy: Secondary | ICD-10-CM | POA: Diagnosis not present

## 2018-09-13 DIAGNOSIS — M79673 Pain in unspecified foot: Secondary | ICD-10-CM | POA: Diagnosis not present

## 2018-10-15 ENCOUNTER — Encounter: Payer: Self-pay | Admitting: Internal Medicine

## 2018-10-24 ENCOUNTER — Encounter: Payer: Self-pay | Admitting: Internal Medicine

## 2018-10-25 NOTE — Telephone Encounter (Signed)
Please advise patient about adding lab.

## 2018-11-02 DIAGNOSIS — M791 Myalgia, unspecified site: Secondary | ICD-10-CM | POA: Diagnosis not present

## 2018-12-17 DIAGNOSIS — M25512 Pain in left shoulder: Secondary | ICD-10-CM | POA: Diagnosis not present

## 2018-12-17 DIAGNOSIS — M545 Low back pain: Secondary | ICD-10-CM | POA: Diagnosis not present

## 2018-12-17 DIAGNOSIS — Z23 Encounter for immunization: Secondary | ICD-10-CM | POA: Diagnosis not present

## 2018-12-17 DIAGNOSIS — I10 Essential (primary) hypertension: Secondary | ICD-10-CM | POA: Diagnosis not present

## 2018-12-17 DIAGNOSIS — R0789 Other chest pain: Secondary | ICD-10-CM | POA: Diagnosis not present

## 2018-12-17 DIAGNOSIS — E785 Hyperlipidemia, unspecified: Secondary | ICD-10-CM | POA: Diagnosis not present

## 2018-12-28 DIAGNOSIS — R079 Chest pain, unspecified: Secondary | ICD-10-CM | POA: Diagnosis not present

## 2019-01-04 DIAGNOSIS — M5416 Radiculopathy, lumbar region: Secondary | ICD-10-CM | POA: Diagnosis not present

## 2019-01-04 DIAGNOSIS — M79673 Pain in unspecified foot: Secondary | ICD-10-CM | POA: Diagnosis not present

## 2019-01-04 DIAGNOSIS — M791 Myalgia, unspecified site: Secondary | ICD-10-CM | POA: Diagnosis not present

## 2019-01-04 DIAGNOSIS — G4710 Hypersomnia, unspecified: Secondary | ICD-10-CM | POA: Diagnosis not present

## 2019-01-04 DIAGNOSIS — G4733 Obstructive sleep apnea (adult) (pediatric): Secondary | ICD-10-CM | POA: Diagnosis not present

## 2019-01-12 DIAGNOSIS — K5730 Diverticulosis of large intestine without perforation or abscess without bleeding: Secondary | ICD-10-CM | POA: Diagnosis not present

## 2019-01-12 DIAGNOSIS — Z79899 Other long term (current) drug therapy: Secondary | ICD-10-CM | POA: Diagnosis not present

## 2019-01-12 DIAGNOSIS — R52 Pain, unspecified: Secondary | ICD-10-CM | POA: Diagnosis not present

## 2019-01-12 DIAGNOSIS — M5489 Other dorsalgia: Secondary | ICD-10-CM | POA: Diagnosis not present

## 2019-01-12 DIAGNOSIS — Z87891 Personal history of nicotine dependence: Secondary | ICD-10-CM | POA: Diagnosis not present

## 2019-01-12 DIAGNOSIS — M545 Low back pain: Secondary | ICD-10-CM | POA: Diagnosis not present

## 2019-01-12 DIAGNOSIS — R109 Unspecified abdominal pain: Secondary | ICD-10-CM | POA: Diagnosis not present

## 2019-01-12 DIAGNOSIS — M62830 Muscle spasm of back: Secondary | ICD-10-CM | POA: Diagnosis not present

## 2019-01-12 DIAGNOSIS — I700 Atherosclerosis of aorta: Secondary | ICD-10-CM | POA: Diagnosis not present

## 2019-01-12 DIAGNOSIS — I10 Essential (primary) hypertension: Secondary | ICD-10-CM | POA: Diagnosis not present

## 2019-02-14 DIAGNOSIS — I4891 Unspecified atrial fibrillation: Secondary | ICD-10-CM | POA: Diagnosis not present

## 2019-02-14 DIAGNOSIS — I129 Hypertensive chronic kidney disease with stage 1 through stage 4 chronic kidney disease, or unspecified chronic kidney disease: Secondary | ICD-10-CM | POA: Diagnosis not present

## 2019-02-14 DIAGNOSIS — D72829 Elevated white blood cell count, unspecified: Secondary | ICD-10-CM | POA: Diagnosis not present

## 2019-02-14 DIAGNOSIS — I10 Essential (primary) hypertension: Secondary | ICD-10-CM | POA: Diagnosis not present

## 2019-02-14 DIAGNOSIS — R05 Cough: Secondary | ICD-10-CM | POA: Diagnosis not present

## 2019-02-14 DIAGNOSIS — J9601 Acute respiratory failure with hypoxia: Secondary | ICD-10-CM | POA: Diagnosis not present

## 2019-02-14 DIAGNOSIS — J181 Lobar pneumonia, unspecified organism: Secondary | ICD-10-CM | POA: Diagnosis not present

## 2019-02-14 DIAGNOSIS — Z209 Contact with and (suspected) exposure to unspecified communicable disease: Secondary | ICD-10-CM | POA: Diagnosis not present

## 2019-02-14 DIAGNOSIS — Z20828 Contact with and (suspected) exposure to other viral communicable diseases: Secondary | ICD-10-CM | POA: Diagnosis not present

## 2019-02-14 DIAGNOSIS — R0902 Hypoxemia: Secondary | ICD-10-CM | POA: Diagnosis not present

## 2019-02-14 DIAGNOSIS — E872 Acidosis: Secondary | ICD-10-CM | POA: Diagnosis not present

## 2019-02-14 DIAGNOSIS — G4733 Obstructive sleep apnea (adult) (pediatric): Secondary | ICD-10-CM | POA: Diagnosis not present

## 2019-02-14 DIAGNOSIS — N189 Chronic kidney disease, unspecified: Secondary | ICD-10-CM | POA: Diagnosis not present

## 2019-02-14 DIAGNOSIS — J440 Chronic obstructive pulmonary disease with acute lower respiratory infection: Secondary | ICD-10-CM | POA: Diagnosis not present

## 2019-02-14 DIAGNOSIS — R0781 Pleurodynia: Secondary | ICD-10-CM | POA: Diagnosis not present

## 2019-02-14 DIAGNOSIS — A419 Sepsis, unspecified organism: Secondary | ICD-10-CM | POA: Diagnosis not present

## 2019-02-14 DIAGNOSIS — R0602 Shortness of breath: Secondary | ICD-10-CM | POA: Diagnosis not present

## 2019-02-14 DIAGNOSIS — J869 Pyothorax without fistula: Secondary | ICD-10-CM | POA: Diagnosis not present

## 2019-02-14 DIAGNOSIS — R509 Fever, unspecified: Secondary | ICD-10-CM | POA: Diagnosis not present

## 2019-02-14 DIAGNOSIS — J9811 Atelectasis: Secondary | ICD-10-CM | POA: Diagnosis not present

## 2019-02-14 DIAGNOSIS — N170 Acute kidney failure with tubular necrosis: Secondary | ICD-10-CM | POA: Diagnosis not present

## 2019-02-14 DIAGNOSIS — E876 Hypokalemia: Secondary | ICD-10-CM | POA: Diagnosis not present

## 2019-02-14 DIAGNOSIS — N2589 Other disorders resulting from impaired renal tubular function: Secondary | ICD-10-CM | POA: Diagnosis not present

## 2019-02-14 DIAGNOSIS — R0689 Other abnormalities of breathing: Secondary | ICD-10-CM | POA: Diagnosis not present

## 2019-02-14 DIAGNOSIS — E871 Hypo-osmolality and hyponatremia: Secondary | ICD-10-CM | POA: Diagnosis not present

## 2019-02-14 DIAGNOSIS — I2510 Atherosclerotic heart disease of native coronary artery without angina pectoris: Secondary | ICD-10-CM | POA: Diagnosis not present

## 2019-02-14 DIAGNOSIS — J90 Pleural effusion, not elsewhere classified: Secondary | ICD-10-CM | POA: Diagnosis not present

## 2019-02-14 DIAGNOSIS — J918 Pleural effusion in other conditions classified elsewhere: Secondary | ICD-10-CM | POA: Diagnosis not present

## 2019-02-14 DIAGNOSIS — Z4682 Encounter for fitting and adjustment of non-vascular catheter: Secondary | ICD-10-CM | POA: Diagnosis not present

## 2019-02-14 DIAGNOSIS — J189 Pneumonia, unspecified organism: Secondary | ICD-10-CM | POA: Diagnosis not present

## 2019-02-14 DIAGNOSIS — R791 Abnormal coagulation profile: Secondary | ICD-10-CM | POA: Diagnosis not present

## 2019-02-14 DIAGNOSIS — J9691 Respiratory failure, unspecified with hypoxia: Secondary | ICD-10-CM | POA: Diagnosis not present

## 2019-02-14 DIAGNOSIS — N179 Acute kidney failure, unspecified: Secondary | ICD-10-CM | POA: Diagnosis not present

## 2019-02-14 DIAGNOSIS — J441 Chronic obstructive pulmonary disease with (acute) exacerbation: Secondary | ICD-10-CM | POA: Diagnosis not present

## 2019-02-14 DIAGNOSIS — Z87891 Personal history of nicotine dependence: Secondary | ICD-10-CM | POA: Diagnosis not present

## 2019-02-14 DIAGNOSIS — M545 Low back pain: Secondary | ICD-10-CM | POA: Diagnosis not present

## 2019-02-14 DIAGNOSIS — E8770 Fluid overload, unspecified: Secondary | ICD-10-CM | POA: Diagnosis not present

## 2019-02-14 DIAGNOSIS — R091 Pleurisy: Secondary | ICD-10-CM | POA: Diagnosis not present

## 2019-02-15 DIAGNOSIS — R509 Fever, unspecified: Secondary | ICD-10-CM | POA: Diagnosis not present

## 2019-02-15 DIAGNOSIS — R791 Abnormal coagulation profile: Secondary | ICD-10-CM | POA: Diagnosis not present

## 2019-02-15 DIAGNOSIS — R0781 Pleurodynia: Secondary | ICD-10-CM | POA: Diagnosis not present

## 2019-02-15 DIAGNOSIS — J189 Pneumonia, unspecified organism: Secondary | ICD-10-CM | POA: Diagnosis not present

## 2019-02-15 DIAGNOSIS — A419 Sepsis, unspecified organism: Secondary | ICD-10-CM | POA: Diagnosis not present

## 2019-02-15 DIAGNOSIS — R05 Cough: Secondary | ICD-10-CM | POA: Diagnosis not present

## 2019-02-15 DIAGNOSIS — J181 Lobar pneumonia, unspecified organism: Secondary | ICD-10-CM | POA: Diagnosis not present

## 2019-02-16 DIAGNOSIS — J189 Pneumonia, unspecified organism: Secondary | ICD-10-CM | POA: Diagnosis not present

## 2019-02-16 DIAGNOSIS — R509 Fever, unspecified: Secondary | ICD-10-CM | POA: Diagnosis not present

## 2019-02-16 DIAGNOSIS — J90 Pleural effusion, not elsewhere classified: Secondary | ICD-10-CM | POA: Diagnosis not present

## 2019-02-16 DIAGNOSIS — A419 Sepsis, unspecified organism: Secondary | ICD-10-CM | POA: Diagnosis not present

## 2019-02-17 DIAGNOSIS — J869 Pyothorax without fistula: Secondary | ICD-10-CM | POA: Diagnosis not present

## 2019-02-17 DIAGNOSIS — A419 Sepsis, unspecified organism: Secondary | ICD-10-CM | POA: Diagnosis not present

## 2019-02-17 DIAGNOSIS — J441 Chronic obstructive pulmonary disease with (acute) exacerbation: Secondary | ICD-10-CM | POA: Diagnosis not present

## 2019-02-17 DIAGNOSIS — J9601 Acute respiratory failure with hypoxia: Secondary | ICD-10-CM | POA: Diagnosis not present

## 2019-02-17 DIAGNOSIS — J9691 Respiratory failure, unspecified with hypoxia: Secondary | ICD-10-CM | POA: Diagnosis not present

## 2019-02-17 DIAGNOSIS — J9811 Atelectasis: Secondary | ICD-10-CM | POA: Diagnosis not present

## 2019-02-17 DIAGNOSIS — J189 Pneumonia, unspecified organism: Secondary | ICD-10-CM | POA: Diagnosis not present

## 2019-02-17 DIAGNOSIS — J90 Pleural effusion, not elsewhere classified: Secondary | ICD-10-CM | POA: Diagnosis not present

## 2019-02-17 DIAGNOSIS — G4733 Obstructive sleep apnea (adult) (pediatric): Secondary | ICD-10-CM | POA: Diagnosis not present

## 2019-02-17 DIAGNOSIS — Z4682 Encounter for fitting and adjustment of non-vascular catheter: Secondary | ICD-10-CM | POA: Diagnosis not present

## 2019-02-17 DIAGNOSIS — R509 Fever, unspecified: Secondary | ICD-10-CM | POA: Diagnosis not present

## 2019-02-18 DIAGNOSIS — G4733 Obstructive sleep apnea (adult) (pediatric): Secondary | ICD-10-CM | POA: Diagnosis not present

## 2019-02-18 DIAGNOSIS — J9691 Respiratory failure, unspecified with hypoxia: Secondary | ICD-10-CM | POA: Diagnosis not present

## 2019-02-18 DIAGNOSIS — D72829 Elevated white blood cell count, unspecified: Secondary | ICD-10-CM | POA: Diagnosis not present

## 2019-02-18 DIAGNOSIS — J90 Pleural effusion, not elsewhere classified: Secondary | ICD-10-CM | POA: Diagnosis not present

## 2019-02-18 DIAGNOSIS — J189 Pneumonia, unspecified organism: Secondary | ICD-10-CM | POA: Diagnosis not present

## 2019-02-18 DIAGNOSIS — A419 Sepsis, unspecified organism: Secondary | ICD-10-CM | POA: Diagnosis not present

## 2019-02-18 DIAGNOSIS — I4891 Unspecified atrial fibrillation: Secondary | ICD-10-CM | POA: Diagnosis not present

## 2019-02-18 DIAGNOSIS — R509 Fever, unspecified: Secondary | ICD-10-CM | POA: Diagnosis not present

## 2019-02-18 DIAGNOSIS — J441 Chronic obstructive pulmonary disease with (acute) exacerbation: Secondary | ICD-10-CM | POA: Diagnosis not present

## 2019-02-19 DIAGNOSIS — J189 Pneumonia, unspecified organism: Secondary | ICD-10-CM | POA: Diagnosis not present

## 2019-02-19 DIAGNOSIS — J869 Pyothorax without fistula: Secondary | ICD-10-CM | POA: Diagnosis not present

## 2019-02-19 DIAGNOSIS — J90 Pleural effusion, not elsewhere classified: Secondary | ICD-10-CM | POA: Diagnosis not present

## 2019-02-19 DIAGNOSIS — R509 Fever, unspecified: Secondary | ICD-10-CM | POA: Diagnosis not present

## 2019-02-19 DIAGNOSIS — A419 Sepsis, unspecified organism: Secondary | ICD-10-CM | POA: Diagnosis not present

## 2019-02-20 DIAGNOSIS — R509 Fever, unspecified: Secondary | ICD-10-CM | POA: Diagnosis not present

## 2019-02-20 DIAGNOSIS — A419 Sepsis, unspecified organism: Secondary | ICD-10-CM | POA: Diagnosis not present

## 2019-02-20 DIAGNOSIS — J189 Pneumonia, unspecified organism: Secondary | ICD-10-CM | POA: Diagnosis not present

## 2019-02-20 DIAGNOSIS — Z4682 Encounter for fitting and adjustment of non-vascular catheter: Secondary | ICD-10-CM | POA: Diagnosis not present

## 2019-02-20 DIAGNOSIS — J90 Pleural effusion, not elsewhere classified: Secondary | ICD-10-CM | POA: Diagnosis not present

## 2019-02-21 DIAGNOSIS — R091 Pleurisy: Secondary | ICD-10-CM | POA: Diagnosis not present

## 2019-02-21 DIAGNOSIS — D72829 Elevated white blood cell count, unspecified: Secondary | ICD-10-CM | POA: Diagnosis not present

## 2019-02-21 DIAGNOSIS — R509 Fever, unspecified: Secondary | ICD-10-CM | POA: Diagnosis not present

## 2019-02-21 DIAGNOSIS — N179 Acute kidney failure, unspecified: Secondary | ICD-10-CM | POA: Diagnosis not present

## 2019-02-21 DIAGNOSIS — Z4682 Encounter for fitting and adjustment of non-vascular catheter: Secondary | ICD-10-CM | POA: Diagnosis not present

## 2019-02-21 DIAGNOSIS — J189 Pneumonia, unspecified organism: Secondary | ICD-10-CM | POA: Diagnosis not present

## 2019-02-21 DIAGNOSIS — J9811 Atelectasis: Secondary | ICD-10-CM | POA: Diagnosis not present

## 2019-02-21 DIAGNOSIS — J90 Pleural effusion, not elsewhere classified: Secondary | ICD-10-CM | POA: Diagnosis not present

## 2019-02-21 DIAGNOSIS — J869 Pyothorax without fistula: Secondary | ICD-10-CM | POA: Diagnosis not present

## 2019-02-22 DIAGNOSIS — E871 Hypo-osmolality and hyponatremia: Secondary | ICD-10-CM | POA: Diagnosis not present

## 2019-02-22 DIAGNOSIS — J9811 Atelectasis: Secondary | ICD-10-CM | POA: Diagnosis not present

## 2019-02-22 DIAGNOSIS — A419 Sepsis, unspecified organism: Secondary | ICD-10-CM | POA: Diagnosis not present

## 2019-02-22 DIAGNOSIS — N189 Chronic kidney disease, unspecified: Secondary | ICD-10-CM | POA: Diagnosis not present

## 2019-02-22 DIAGNOSIS — N179 Acute kidney failure, unspecified: Secondary | ICD-10-CM | POA: Diagnosis not present

## 2019-02-22 DIAGNOSIS — R509 Fever, unspecified: Secondary | ICD-10-CM | POA: Diagnosis not present

## 2019-02-22 DIAGNOSIS — J869 Pyothorax without fistula: Secondary | ICD-10-CM | POA: Diagnosis not present

## 2019-02-22 DIAGNOSIS — J90 Pleural effusion, not elsewhere classified: Secondary | ICD-10-CM | POA: Diagnosis not present

## 2019-02-22 DIAGNOSIS — J189 Pneumonia, unspecified organism: Secondary | ICD-10-CM | POA: Diagnosis not present

## 2019-02-23 DIAGNOSIS — J869 Pyothorax without fistula: Secondary | ICD-10-CM | POA: Diagnosis not present

## 2019-02-23 DIAGNOSIS — J181 Lobar pneumonia, unspecified organism: Secondary | ICD-10-CM | POA: Diagnosis not present

## 2019-02-23 DIAGNOSIS — I4891 Unspecified atrial fibrillation: Secondary | ICD-10-CM | POA: Diagnosis not present

## 2019-02-23 DIAGNOSIS — J9601 Acute respiratory failure with hypoxia: Secondary | ICD-10-CM | POA: Diagnosis not present

## 2019-02-23 DIAGNOSIS — N179 Acute kidney failure, unspecified: Secondary | ICD-10-CM | POA: Diagnosis not present

## 2019-02-23 DIAGNOSIS — R509 Fever, unspecified: Secondary | ICD-10-CM | POA: Diagnosis not present

## 2019-02-23 DIAGNOSIS — E871 Hypo-osmolality and hyponatremia: Secondary | ICD-10-CM | POA: Diagnosis not present

## 2019-02-23 DIAGNOSIS — N189 Chronic kidney disease, unspecified: Secondary | ICD-10-CM | POA: Diagnosis not present

## 2019-02-23 DIAGNOSIS — E872 Acidosis: Secondary | ICD-10-CM | POA: Diagnosis not present

## 2019-02-24 DIAGNOSIS — N179 Acute kidney failure, unspecified: Secondary | ICD-10-CM | POA: Diagnosis not present

## 2019-02-24 DIAGNOSIS — N189 Chronic kidney disease, unspecified: Secondary | ICD-10-CM | POA: Diagnosis not present

## 2019-02-24 DIAGNOSIS — E871 Hypo-osmolality and hyponatremia: Secondary | ICD-10-CM | POA: Diagnosis not present

## 2019-02-24 DIAGNOSIS — R509 Fever, unspecified: Secondary | ICD-10-CM | POA: Diagnosis not present

## 2019-02-24 DIAGNOSIS — J9601 Acute respiratory failure with hypoxia: Secondary | ICD-10-CM | POA: Diagnosis not present

## 2019-02-24 DIAGNOSIS — J189 Pneumonia, unspecified organism: Secondary | ICD-10-CM | POA: Diagnosis not present

## 2019-02-24 DIAGNOSIS — E872 Acidosis: Secondary | ICD-10-CM | POA: Diagnosis not present

## 2019-02-25 DIAGNOSIS — N179 Acute kidney failure, unspecified: Secondary | ICD-10-CM | POA: Diagnosis not present

## 2019-02-25 DIAGNOSIS — J189 Pneumonia, unspecified organism: Secondary | ICD-10-CM | POA: Diagnosis not present

## 2019-02-25 DIAGNOSIS — N189 Chronic kidney disease, unspecified: Secondary | ICD-10-CM | POA: Diagnosis not present

## 2019-02-25 DIAGNOSIS — J9601 Acute respiratory failure with hypoxia: Secondary | ICD-10-CM | POA: Diagnosis not present

## 2019-02-25 DIAGNOSIS — I129 Hypertensive chronic kidney disease with stage 1 through stage 4 chronic kidney disease, or unspecified chronic kidney disease: Secondary | ICD-10-CM | POA: Diagnosis not present

## 2019-02-25 DIAGNOSIS — E871 Hypo-osmolality and hyponatremia: Secondary | ICD-10-CM | POA: Diagnosis not present

## 2019-02-25 DIAGNOSIS — R509 Fever, unspecified: Secondary | ICD-10-CM | POA: Diagnosis not present

## 2019-02-26 DIAGNOSIS — J189 Pneumonia, unspecified organism: Secondary | ICD-10-CM | POA: Diagnosis not present

## 2019-02-26 DIAGNOSIS — J90 Pleural effusion, not elsewhere classified: Secondary | ICD-10-CM | POA: Diagnosis not present

## 2019-02-26 DIAGNOSIS — I129 Hypertensive chronic kidney disease with stage 1 through stage 4 chronic kidney disease, or unspecified chronic kidney disease: Secondary | ICD-10-CM | POA: Diagnosis not present

## 2019-02-26 DIAGNOSIS — E871 Hypo-osmolality and hyponatremia: Secondary | ICD-10-CM | POA: Diagnosis not present

## 2019-02-26 DIAGNOSIS — J9811 Atelectasis: Secondary | ICD-10-CM | POA: Diagnosis not present

## 2019-02-26 DIAGNOSIS — N179 Acute kidney failure, unspecified: Secondary | ICD-10-CM | POA: Diagnosis not present

## 2019-02-26 DIAGNOSIS — J181 Lobar pneumonia, unspecified organism: Secondary | ICD-10-CM | POA: Diagnosis not present

## 2019-02-26 DIAGNOSIS — N189 Chronic kidney disease, unspecified: Secondary | ICD-10-CM | POA: Diagnosis not present

## 2019-02-26 DIAGNOSIS — R509 Fever, unspecified: Secondary | ICD-10-CM | POA: Diagnosis not present

## 2019-02-26 DIAGNOSIS — J869 Pyothorax without fistula: Secondary | ICD-10-CM | POA: Diagnosis not present

## 2019-02-27 DIAGNOSIS — E871 Hypo-osmolality and hyponatremia: Secondary | ICD-10-CM | POA: Diagnosis not present

## 2019-02-27 DIAGNOSIS — I129 Hypertensive chronic kidney disease with stage 1 through stage 4 chronic kidney disease, or unspecified chronic kidney disease: Secondary | ICD-10-CM | POA: Diagnosis not present

## 2019-02-27 DIAGNOSIS — N179 Acute kidney failure, unspecified: Secondary | ICD-10-CM | POA: Diagnosis not present

## 2019-02-27 DIAGNOSIS — N189 Chronic kidney disease, unspecified: Secondary | ICD-10-CM | POA: Diagnosis not present

## 2019-02-27 DIAGNOSIS — J189 Pneumonia, unspecified organism: Secondary | ICD-10-CM | POA: Diagnosis not present

## 2019-02-27 DIAGNOSIS — R509 Fever, unspecified: Secondary | ICD-10-CM | POA: Diagnosis not present

## 2019-02-27 DIAGNOSIS — J869 Pyothorax without fistula: Secondary | ICD-10-CM | POA: Diagnosis not present

## 2019-02-28 ENCOUNTER — Telehealth: Payer: Self-pay

## 2019-02-28 DIAGNOSIS — I4891 Unspecified atrial fibrillation: Secondary | ICD-10-CM | POA: Diagnosis not present

## 2019-02-28 DIAGNOSIS — J189 Pneumonia, unspecified organism: Secondary | ICD-10-CM | POA: Diagnosis not present

## 2019-02-28 DIAGNOSIS — J869 Pyothorax without fistula: Secondary | ICD-10-CM | POA: Diagnosis not present

## 2019-02-28 DIAGNOSIS — E872 Acidosis: Secondary | ICD-10-CM | POA: Diagnosis not present

## 2019-02-28 DIAGNOSIS — N179 Acute kidney failure, unspecified: Secondary | ICD-10-CM | POA: Diagnosis not present

## 2019-02-28 DIAGNOSIS — E871 Hypo-osmolality and hyponatremia: Secondary | ICD-10-CM | POA: Diagnosis not present

## 2019-02-28 DIAGNOSIS — N189 Chronic kidney disease, unspecified: Secondary | ICD-10-CM | POA: Diagnosis not present

## 2019-02-28 DIAGNOSIS — R509 Fever, unspecified: Secondary | ICD-10-CM | POA: Diagnosis not present

## 2019-02-28 MED ORDER — ALBUTEROL SULFATE (2.5 MG/3ML) 0.083% IN NEBU
2.50 | INHALATION_SOLUTION | RESPIRATORY_TRACT | Status: DC
Start: ? — End: 2019-02-28

## 2019-02-28 MED ORDER — GUAIFENESIN ER 600 MG PO TB12
600.00 | ORAL_TABLET | ORAL | Status: DC
Start: 2019-02-28 — End: 2019-02-28

## 2019-02-28 MED ORDER — LIDOCAINE HCL 1 % IJ SOLN
.50 | INTRAMUSCULAR | Status: DC
Start: ? — End: 2019-02-28

## 2019-02-28 MED ORDER — FUROSEMIDE 40 MG PO TABS
40.00 | ORAL_TABLET | ORAL | Status: DC
Start: 2019-03-01 — End: 2019-02-28

## 2019-02-28 MED ORDER — SALINE NASAL SPRAY 0.65 % NA SOLN
1.00 | NASAL | Status: DC
Start: ? — End: 2019-02-28

## 2019-02-28 MED ORDER — ATORVASTATIN CALCIUM 20 MG PO TABS
20.00 | ORAL_TABLET | ORAL | Status: DC
Start: 2019-03-01 — End: 2019-02-28

## 2019-02-28 MED ORDER — POLYETHYLENE GLYCOL 3350 17 G PO PACK
17.00 | PACK | ORAL | Status: DC
Start: 2019-02-28 — End: 2019-02-28

## 2019-02-28 MED ORDER — GABAPENTIN 100 MG PO CAPS
100.00 | ORAL_CAPSULE | ORAL | Status: DC
Start: 2019-02-28 — End: 2019-02-28

## 2019-02-28 MED ORDER — DILTIAZEM HCL ER COATED BEADS 120 MG PO CP24
120.00 | ORAL_CAPSULE | ORAL | Status: DC
Start: 2019-03-01 — End: 2019-02-28

## 2019-02-28 MED ORDER — BENZOCAINE-MENTHOL 15-2.6 MG MT LOZG
1.00 | LOZENGE | OROMUCOSAL | Status: DC
Start: ? — End: 2019-02-28

## 2019-02-28 MED ORDER — CULTURELLE DIGESTIVE HEALTH PO CAPS
1.00 | ORAL_CAPSULE | ORAL | Status: DC
Start: 2019-03-01 — End: 2019-02-28

## 2019-02-28 MED ORDER — METRONIDAZOLE IN NACL 5-0.79 MG/ML-% IV SOLN
500.00 | INTRAVENOUS | Status: DC
Start: 2019-02-28 — End: 2019-02-28

## 2019-02-28 MED ORDER — ASPIRIN 81 MG PO CHEW
81.00 | CHEWABLE_TABLET | ORAL | Status: DC
Start: 2019-03-01 — End: 2019-02-28

## 2019-02-28 MED ORDER — MORPHINE SULFATE 2 MG/ML IJ SOLN
1.00 | INTRAMUSCULAR | Status: DC
Start: ? — End: 2019-02-28

## 2019-02-28 MED ORDER — MELATONIN 3 MG PO TABS
3.00 | ORAL_TABLET | ORAL | Status: DC
Start: 2019-02-28 — End: 2019-02-28

## 2019-02-28 MED ORDER — ALPRAZOLAM 0.25 MG PO TABS
0.25 | ORAL_TABLET | ORAL | Status: DC
Start: ? — End: 2019-02-28

## 2019-02-28 MED ORDER — SODIUM CHLORIDE 3 % IN NEBU
4.00 | INHALATION_SOLUTION | RESPIRATORY_TRACT | Status: DC
Start: ? — End: 2019-02-28

## 2019-02-28 MED ORDER — GENERIC EXTERNAL MEDICATION
Status: DC
Start: ? — End: 2019-02-28

## 2019-02-28 MED ORDER — SENNOSIDES-DOCUSATE SODIUM 8.6-50 MG PO TABS
2.00 | ORAL_TABLET | ORAL | Status: DC
Start: 2019-02-28 — End: 2019-02-28

## 2019-02-28 MED ORDER — GENERIC EXTERNAL MEDICATION
1.00 | Status: DC
Start: 2019-02-28 — End: 2019-02-28

## 2019-02-28 MED ORDER — OXYCODONE HCL 5 MG PO TABS
5.00 | ORAL_TABLET | ORAL | Status: DC
Start: ? — End: 2019-02-28

## 2019-02-28 MED ORDER — GENERIC EXTERNAL MEDICATION
1.00 | Status: DC
Start: ? — End: 2019-02-28

## 2019-02-28 MED ORDER — GENERIC EXTERNAL MEDICATION
1.50 | Status: DC
Start: 2019-03-01 — End: 2019-02-28

## 2019-02-28 MED ORDER — PANTOPRAZOLE SODIUM 20 MG PO TBEC
20.00 | DELAYED_RELEASE_TABLET | ORAL | Status: DC
Start: 2019-03-01 — End: 2019-02-28

## 2019-02-28 MED ORDER — ZOLPIDEM TARTRATE 5 MG PO TABS
5.00 | ORAL_TABLET | ORAL | Status: DC
Start: ? — End: 2019-02-28

## 2019-02-28 MED ORDER — ACETAMINOPHEN 325 MG PO TABS
650.00 | ORAL_TABLET | ORAL | Status: DC
Start: ? — End: 2019-02-28

## 2019-02-28 MED ORDER — BENZONATATE 100 MG PO CAPS
100.00 | ORAL_CAPSULE | ORAL | Status: DC
Start: 2019-02-28 — End: 2019-02-28

## 2019-02-28 NOTE — Telephone Encounter (Signed)
Patient wife called saying pt has been in the hospital for 2 weeks now. [place] they are not speaking to him much or answering his questions. Said he tested Neg for COVID but he has bacterial pneumonia. Patient feels isolated and doesn't know what's going on. Said she is concerned about him. Doesn't expect Dr. Judithann Graves to be able to do anything about this but is just very upset. Patient began crying while on the phone. Patient has been seen by rotating Doctors. Feels like she is helpless because she cannot see him.   Told patient wife we want to follow up with him here at our office when he gets out of hospital.   Wife verbalized understanding.

## 2019-03-01 DIAGNOSIS — R509 Fever, unspecified: Secondary | ICD-10-CM | POA: Diagnosis not present

## 2019-03-01 DIAGNOSIS — E871 Hypo-osmolality and hyponatremia: Secondary | ICD-10-CM | POA: Diagnosis not present

## 2019-03-01 DIAGNOSIS — J189 Pneumonia, unspecified organism: Secondary | ICD-10-CM | POA: Diagnosis not present

## 2019-03-01 DIAGNOSIS — I4891 Unspecified atrial fibrillation: Secondary | ICD-10-CM | POA: Diagnosis not present

## 2019-03-01 DIAGNOSIS — E872 Acidosis: Secondary | ICD-10-CM | POA: Diagnosis not present

## 2019-03-01 DIAGNOSIS — N179 Acute kidney failure, unspecified: Secondary | ICD-10-CM | POA: Diagnosis not present

## 2019-03-01 DIAGNOSIS — N189 Chronic kidney disease, unspecified: Secondary | ICD-10-CM | POA: Diagnosis not present

## 2019-03-01 DIAGNOSIS — J869 Pyothorax without fistula: Secondary | ICD-10-CM | POA: Diagnosis not present

## 2019-03-02 DIAGNOSIS — I129 Hypertensive chronic kidney disease with stage 1 through stage 4 chronic kidney disease, or unspecified chronic kidney disease: Secondary | ICD-10-CM | POA: Diagnosis not present

## 2019-03-02 DIAGNOSIS — N179 Acute kidney failure, unspecified: Secondary | ICD-10-CM | POA: Diagnosis not present

## 2019-03-02 DIAGNOSIS — E871 Hypo-osmolality and hyponatremia: Secondary | ICD-10-CM | POA: Diagnosis not present

## 2019-03-02 DIAGNOSIS — N189 Chronic kidney disease, unspecified: Secondary | ICD-10-CM | POA: Diagnosis not present

## 2019-03-08 ENCOUNTER — Other Ambulatory Visit: Payer: Self-pay | Admitting: Internal Medicine

## 2019-03-09 ENCOUNTER — Encounter: Payer: Self-pay | Admitting: Internal Medicine

## 2019-03-09 ENCOUNTER — Other Ambulatory Visit: Payer: Self-pay

## 2019-03-09 ENCOUNTER — Ambulatory Visit: Payer: BLUE CROSS/BLUE SHIELD | Admitting: Internal Medicine

## 2019-03-09 VITALS — BP 126/82 | HR 78 | Ht 70.0 in | Wt 233.0 lb

## 2019-03-09 DIAGNOSIS — E559 Vitamin D deficiency, unspecified: Secondary | ICD-10-CM | POA: Insufficient documentation

## 2019-03-09 DIAGNOSIS — N179 Acute kidney failure, unspecified: Secondary | ICD-10-CM

## 2019-03-09 DIAGNOSIS — J189 Pneumonia, unspecified organism: Secondary | ICD-10-CM

## 2019-03-09 DIAGNOSIS — J181 Lobar pneumonia, unspecified organism: Secondary | ICD-10-CM

## 2019-03-09 DIAGNOSIS — I10 Essential (primary) hypertension: Secondary | ICD-10-CM

## 2019-03-09 DIAGNOSIS — F39 Unspecified mood [affective] disorder: Secondary | ICD-10-CM

## 2019-03-09 DIAGNOSIS — I4891 Unspecified atrial fibrillation: Secondary | ICD-10-CM

## 2019-03-09 DIAGNOSIS — Z8679 Personal history of other diseases of the circulatory system: Secondary | ICD-10-CM | POA: Insufficient documentation

## 2019-03-09 DIAGNOSIS — G4733 Obstructive sleep apnea (adult) (pediatric): Secondary | ICD-10-CM

## 2019-03-09 DIAGNOSIS — Z9989 Dependence on other enabling machines and devices: Secondary | ICD-10-CM

## 2019-03-09 HISTORY — DX: Pneumonia, unspecified organism: J18.9

## 2019-03-09 MED ORDER — GUAIFENESIN ER 600 MG PO TB12: 600.0000 mg | ORAL_TABLET | Freq: Two times a day (BID) | ORAL | 0 refills | Status: DC

## 2019-03-09 MED ORDER — CHOLECALCIFEROL 1.25 MG (50000 UT) PO CAPS: 50000.0000 [IU] | ORAL_CAPSULE | ORAL | 1 refills | Status: AC

## 2019-03-09 MED ORDER — DILTIAZEM HCL ER COATED BEADS 120 MG PO CP24: 120.0000 mg | ORAL_CAPSULE | Freq: Every day | ORAL | 0 refills | Status: DC

## 2019-03-09 NOTE — Progress Notes (Signed)
Date:  03/09/2019   Name:  Chad Choi   DOB:  [birth year]   MRN:  [id number]   Chief Complaint: Depression and Pneumonia [facility] to 03/02/19. Summary of dx: Fever, unspecified (Primary Dx);  Sepsis without acute organ dysfunction, due to unspecified organism (CMS-HCC);  COPD exacerbation (CMS-HCC);  Pneumonia of both lungs due to infectious organism, unspecified part of lung;  Chest discomfort;  Elevated d-dimer;   Community acquired pneumonia, unspecified laterality;  Empyema (CMS-HCC); 63 year old gentleman admitted on 02/14/2019 with principal problem sepsis due to RLL CAP complicated with a large exudative parapneumonic effusion,s/p thoracocentesis, chest tube placement, subsequently chest tube removed on 5/11. - When clinically improved may transition to oral Augmentin, patient recommended to receive a total of 4 weeks of antibiotics.   Tachycardia;  Atrial fibrillation with rapid ventricular response (CMS-HCC); Was previously on norvasc now changed to oral diltiazem.  - Changed dose of diltiazem to 120 mg p.o. once daily.  - cont ASA   Acute respiratory failure with hypoxia (CMS-HCC);  Atrial fibrillation, unspecified type (CMS-HCC);   AKI (acute kidney injury) (CMS-HCC)History of Present Illness: Chad Choi is a 63 y.o. male with a past medical Hx significant for copd who was admitted with Community acquired pneumonia. Complicated by a parapneumonic effusion requiring a chest tube. Cr baseline=0.8. Presented 02/14/19 with pneumonia and effusion requiring chest tube. Got a dye load about 5/4. + nsaids prior to admit vanc begun 02/16/19 with a high 02/21/19 level=33. And vanc stopped . US normal 12cm kidneys, U/a 36rbc otherwise normal, (nonspecific) Ueos negative  Cr=0.8 02/16/19->1.3 02/19/19->3.3 02/21/19->3.5 02/22/19 No rashes - [facility] at 3 pm. Labs on day of discharge: Bun/cr = 44/2.7 Vitamin D  17 WBC 9.6/ hgb 11.4  Discharge medications: Medication Sig Dispensed Refills Start Date End Date  amLODIPine (NORVASC) 5 MG tablet  Take 5 mg by mouth once daily   0 10/18/2013   atorvastatin (LIPITOR) 20 MG tablet  Take 20 mg by mouth once daily   0 03/12/2016   oxyCODONE (ROXICODONE) 5 MG immediate release tablet  Take 5 mg by mouth every 6 (six) hours as needed for Pain   0 03/05/2016   zolpidem (AMBIEN CR) 12.5 MG CR tablet  Take 12.5 mg by mouth nightly as needed for Sleep   0 04/24/2016   acetaminophen (TYLENOL) 325 MG tablet  Take 2 tablets (650 mg total) by mouth every 6 (six) hours as needed for up to 10 days 30 tablet  0 03/02/2019 03/12/2019  amoxicillin-clavulanate (AUGMENTIN) 875-125 mg tablet  Take 1 tablet (875 mg total) by mouth every 12 (twelve) hours for 28 days 56 tablet  0 03/02/2019 03/30/2019  aspirin 81 MG chewable tablet  Take 1 tablet (81 mg total) by mouth once daily 30 tablet  0 03/03/2019 03/02/2020  cholecalciferol (VITAMIN D3) 1,250 mcg (50,000 unit) capsule  Take 1 capsule (50,000 Units total) by mouth once a week 4 capsule  0 03/02/2019 03/01/2020  diltiazem (CARDIZEM CD) 120 MG XR capsule  Take 1 capsule (120 mg total) by mouth once daily 30 capsule  1 03/03/2019 03/02/2020  FUROsemide (LASIX) 40 MG tablet  Take 1 tablet (40 mg total) by mouth once daily 30 tablet  0 03/03/2019 03/02/2020  gabapentin (NEURONTIN) 100 MG capsule  Take 1 capsule (100 mg total) by mouth once daily 30 capsule  0 03/03/2019 03/02/2020  guaiFENesin (MUCINEX) 600 mg SR tablet  Take  1 tablet (600 mg total) by mouth 2 (two) times daily for 10 days 20 tablet  0 03/02/2019 03/12/2019  hydrocortisone 2.5 % cream  Apply topically 3 (three) times daily 30 g         Depression         This is a new problem.  The onset quality is undetermined.   The problem occurs daily.The problem is unchanged.  Associated symptoms include fatigue and decreased interest.   Associated symptoms include no appetite change, no headaches, not sad and no suicidal ideas.     The symptoms are aggravated by social issues (concerned about finances and health coverage).  Past treatments include nothing. 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  Review of Systems  Constitutional: Positive for fatigue. Negative for appetite change, diaphoresis, fever and unexpected weight change.  HENT: Negative for trouble swallowing.   Respiratory: Negative for cough, chest tightness, shortness of breath and wheezing.   Cardiovascular: Negative for chest pain, palpitations and leg swelling.  Gastrointestinal: Negative for constipation, diarrhea, nausea and vomiting.  Genitourinary: Negative for dysuria and hematuria.  Musculoskeletal: Positive for back pain. Negative for arthralgias, gait problem and joint swelling.  Skin: Negative for rash.  Allergic/Immunologic: Negative for environmental allergies.  Neurological: Positive for light-headedness. Negative for dizziness, weakness and headaches.  Hematological: Negative for adenopathy.  Psychiatric/Behavioral: Positive for depression, dysphoric mood and sleep disturbance. Negative for suicidal ideas. The patient is not nervous/anxious.     Patient Active Problem List   Diagnosis Date Noted  . Vitamin D deficiency 03/09/2019  . Severe obesity (HCC) 09/07/2018  . History of arthroplasty of right ankle 07/05/2018  . Degeneration of intervertebral disc of cervical region 04/20/2018  . Hyperlipidemia 04/20/2018  . Essential hypertension 04/20/2018  . Hx of adenomatous colonic polyps 04/20/2018  . OSA on CPAP 04/20/2018  . Primary osteoarthritis of both hands 06/06/2016  . Other age-related incipient cataract, bilateral 05/13/2016    Allergies  Allergen Reactions  . Naproxen Hives  . Sulfa Antibiotics Hives and Other (See  Comments)    Past Surgical History:  Procedure Laterality Date  . TOTAL ANKLE REPLACEMENT Right 03/2018    Social History   Tobacco Use  . Smoking status: Former Smoker    Packs/day: 1.50    Years: 16.00    Pack years: 24.00    Types: Cigarettes    Last attempt to quit: 03/28/1987    Years since quitting: 31.9  . Smokeless tobacco: Former Neurosurgeon    Quit date: 1989  Substance Use Topics  . Alcohol use: Never    Frequency: Never  . Drug use: Never     Medication list has been reviewed and updated.  Current Meds  Medication Sig  . Acetaminophen 325 MG CAPS Take by mouth.  Marland Kitchen amLODipine (NORVASC) 5 MG tablet Take 1 tablet (5 mg total) by mouth daily.  Marland Kitchen amoxicillin-clavulanate (AUGMENTIN) 875-125 MG tablet Take 1 tablet by mouth 2 (two) times daily.  Marland Kitchen aspirin 81 MG chewable tablet Chew 1 tablet by mouth daily.  Marland Kitchen atorvastatin (LIPITOR) 20 MG tablet Take 1 tablet (20 mg total) by mouth daily at 6 PM.  . Cholecalciferol (D3-50) 1.25 MG (50000 UT) capsule Take 1 capsule (50,000 Units total) by mouth once a week.  . diltiazem (DILACOR XR) 120 MG 24 hr capsule Take 120 mg by mouth daily.  Marland Kitchen  furosemide (LASIX) 40 MG tablet Take 1 tablet by mouth daily.  Marland Kitchen gabapentin (NEURONTIN) 100 MG capsule Take 3 capsules by mouth daily.   . hydrocortisone 2.5 % cream Apply topically.  . NON FORMULARY CPAP nightly  . omeprazole (PRILOSEC) 20 MG capsule Take 20 mg by mouth daily.  . [DISCONTINUED] Cholecalciferol (D3-50) 1.25 MG (50000 UT) capsule Take 1 capsule by mouth once a week.    PHQ 2/9 Scores 03/09/2019 09/07/2018 04/20/2018  PHQ - 2 Score 3 0 0  PHQ- 9 Score 11 - -    BP Readings from Last 3 Encounters:  03/09/19 126/82  09/07/18 116/64  04/20/18 122/80    Physical Exam Vitals signs and nursing note reviewed.  Constitutional:      General: He is not in acute distress.    Appearance: He is well-developed.  HENT:     Head: Normocephalic and atraumatic.  Eyes:     Pupils:  Pupils are equal, round, and reactive to light.  Neck:     Musculoskeletal: Normal range of motion and neck supple.  Cardiovascular:     Rate and Rhythm: Normal rate and regular rhythm.     Pulses: Normal pulses.     Heart sounds: No murmur.  Pulmonary:     Effort: Pulmonary effort is normal. No respiratory distress.     Breath sounds: No wheezing or rhonchi.  Abdominal:     Palpations: Abdomen is soft.     Tenderness: There is no abdominal tenderness.  Musculoskeletal: Normal range of motion.     Right lower leg: No edema.     Left lower leg: No edema.  Skin:    General: Skin is warm and dry.     Capillary Refill: Capillary refill takes less than 2 seconds.     Findings: No rash.  Neurological:     Mental Status: He is alert and oriented to person, place, and time.  Psychiatric:        Behavior: Behavior normal.        Thought Content: Thought content normal.     Wt Readings from Last 3 Encounters:  03/09/19 233 lb (105.7 kg)  09/07/18 257 lb (116.6 kg)  04/20/18 247 lb (112 kg)    BP 126/82   Pulse 78   Ht 5\' 10"  (1.778 m)   Wt 233 lb (105.7 kg)   SpO2 99%   BMI 33.43 kg/m   Assessment and Plan: 1. Community acquired pneumonia of right lower lobe of lung (HCC) Finish course of augmentin Too early to repeat CXR Note to return to work half days for 2 weeks, then full time if he is able  2. Atrial fibrillation, unspecified type (HCC) Resolved Continue ASA 81 mg and Cardiazem  3. Mood disorder (HCC) Due to recent illness - doubt underlying major depressive disorder  4. OSA on CPAP Continue nightly use  5. Essential hypertension Controlled Now of lisinopril - will continue cardiazem  6. AKI (acute kidney injury) [facility]) To see Nephrology tomorrow for follow up and labs  7. Vitamin D deficiency Continue weekly high dose for 6 months then will transition to lower daily dose - Cholecalciferol (D3-50) 1.25 MG (50000 UT) capsule; Take 1 capsule (50,000 Units  total) by mouth once a week.  Dispense: 12 capsule; Refill: 1   Partially dictated using Animal nutritionist. Any errors are unintentional.  Bari Edward, MD [facility]  03/09/2019

## 2019-03-09 NOTE — Patient Instructions (Signed)
Monitor your mood - I believe you will improve greatly once you have completely recovered from your hospitalization.  Continue Vitamin D weekly for 6 months  See Nephrology as planned

## 2019-03-10 ENCOUNTER — Encounter: Payer: Self-pay | Admitting: Internal Medicine

## 2019-03-10 DIAGNOSIS — N179 Acute kidney failure, unspecified: Secondary | ICD-10-CM | POA: Diagnosis not present

## 2019-03-10 HISTORY — DX: Acute kidney failure, unspecified: N17.9

## 2019-03-14 ENCOUNTER — Encounter: Payer: Self-pay | Admitting: Internal Medicine

## 2019-03-14 NOTE — Telephone Encounter (Signed)
Please advise patient work question.

## 2019-03-15 ENCOUNTER — Encounter: Payer: Self-pay | Admitting: Internal Medicine

## 2019-03-15 NOTE — Telephone Encounter (Signed)
Do you recommend anything for this patient. I believe he is referring to the  diltiazem (CARDIZEM CD) 120 MG 24 hr capsule  given by the hospital.  Thank you.

## 2019-03-16 ENCOUNTER — Encounter: Payer: Self-pay | Admitting: Internal Medicine

## 2019-03-16 NOTE — Telephone Encounter (Signed)
Please advise 

## 2019-03-17 DIAGNOSIS — Z7189 Other specified counseling: Secondary | ICD-10-CM | POA: Diagnosis not present

## 2019-03-20 ENCOUNTER — Encounter: Payer: Self-pay | Admitting: Internal Medicine

## 2019-03-21 ENCOUNTER — Encounter: Payer: Self-pay | Admitting: Internal Medicine

## 2019-03-21 NOTE — Telephone Encounter (Signed)
Please advise patient symptoms

## 2019-03-25 ENCOUNTER — Ambulatory Visit
Admission: RE | Admit: 2019-03-25 | Discharge: 2019-03-25 | Disposition: A | Discharge status: Home / Self Care | Payer: BLUE CROSS/BLUE SHIELD | Source: Ambulatory Visit | Attending: Internal Medicine | Admitting: Internal Medicine

## 2019-03-25 ENCOUNTER — Ambulatory Visit
Admission: RE | Admit: 2019-03-25 | Discharge: 2019-03-25 | Disposition: A | Discharge status: Home / Self Care | Payer: BLUE CROSS/BLUE SHIELD | Attending: Internal Medicine | Admitting: Internal Medicine

## 2019-03-25 ENCOUNTER — Ambulatory Visit: Payer: BLUE CROSS/BLUE SHIELD | Admitting: Internal Medicine

## 2019-03-25 ENCOUNTER — Encounter: Payer: Self-pay | Admitting: Internal Medicine

## 2019-03-25 ENCOUNTER — Other Ambulatory Visit: Payer: Self-pay

## 2019-03-25 VITALS — BP 128/80 | HR 68 | Resp 16 | Ht 70.0 in | Wt 232.0 lb

## 2019-03-25 DIAGNOSIS — J181 Lobar pneumonia, unspecified organism: Secondary | ICD-10-CM | POA: Diagnosis not present

## 2019-03-25 DIAGNOSIS — I10 Essential (primary) hypertension: Secondary | ICD-10-CM

## 2019-03-25 DIAGNOSIS — J189 Pneumonia, unspecified organism: Secondary | ICD-10-CM

## 2019-03-25 DIAGNOSIS — I4891 Unspecified atrial fibrillation: Secondary | ICD-10-CM

## 2019-03-25 DIAGNOSIS — J90 Pleural effusion, not elsewhere classified: Secondary | ICD-10-CM | POA: Diagnosis not present

## 2019-03-25 DIAGNOSIS — K625 Hemorrhage of anus and rectum: Secondary | ICD-10-CM

## 2019-03-25 NOTE — Progress Notes (Signed)
Date:  03/25/2019   Name:  Chad Choi   DOB:  09-10-1956   MRN:  [id number]   Chief Complaint: Pneumonia (Follow up. Said he sometimes still has soreness in chest. ) and Blood In Stools (Last time he had blood in stool. None since. That was the only episode. Blood was in the toilet and not just when he wiped. )  Rectal Bleeding  The current episode started 5 to 7 days ago. The problem has been resolved. The patient is experiencing no pain. The stool is described as mixed with blood. There was no prior unsuccessful therapy. Associated symptoms include chest pain (right lower ribs). Pertinent negatives include no fever, no abdominal pain, no diarrhea, no rectal pain, no headaches and no coughing.  Hypertension This is a chronic problem. The problem is controlled. Associated symptoms include chest pain (right lower ribs). Pertinent negatives include no headaches, palpitations or shortness of breath. Past treatments include calcium channel blockers (he was on amlodipine and cardiazem was added). Hypertensive end-organ damage includes CAD/MI. one episode of Afib while hospitalized.   Pneumonia - still has some right sided pleuritic pain.  He gets clammy when he walks any distance but has no increase in SOB, no fever, no cough. He has been working half days and feels that he is ready to go back full time next week.  Review of Systems  Constitutional: Positive for diaphoresis. Negative for chills, fatigue and fever.  Respiratory: Negative for cough, chest tightness, shortness of breath and wheezing.   Cardiovascular: Positive for chest pain (right lower ribs). Negative for palpitations and leg swelling.  Gastrointestinal: Positive for anal bleeding and hematochezia. Negative for abdominal pain, diarrhea and rectal pain. Blood in stool: one episode with a stool.  Allergic/Immunologic: Negative for environmental allergies.  Neurological: Negative for dizziness, weakness, light-headedness and  headaches.  Psychiatric/Behavioral: Negative for dysphoric mood and sleep disturbance. The patient is not nervous/anxious.     Patient Active Problem List   Diagnosis Date Noted  . Vitamin D deficiency 03/09/2019  . Atrial fibrillation ([place]) 03/09/2019  . Community acquired pneumonia of right lower lobe of lung ([place]) 03/09/2019  . Severe obesity ([place]) 09/07/2018  . History of arthroplasty of right ankle 07/05/2018  . Degeneration of intervertebral disc of cervical region 04/20/2018  . Hyperlipidemia 04/20/2018  . Essential hypertension 04/20/2018  . Hx of adenomatous colonic polyps 04/20/2018  . OSA on CPAP 04/20/2018  . Primary osteoarthritis of both hands 06/06/2016  . Other age-related incipient cataract, bilateral 05/13/2016    Allergies  Allergen Reactions  . Naproxen Hives  . Sulfa Antibiotics Hives and Other (See Comments)    Past Surgical History:  Procedure Laterality Date  . TOTAL ANKLE REPLACEMENT Right 03/2018    Social History   Tobacco Use  . Smoking status: Former Smoker    Packs/day: 1.50    Years: 16.00    Pack years: 24.00    Types: Cigarettes    Quit date: 03/28/1987    Years since quitting: 32.0  . Smokeless tobacco: Former Systems developer    Quit date: 1989  Substance Use Topics  . Alcohol use: Never    Frequency: Never  . Drug use: Never     Medication list has been reviewed and updated.  Current Meds  Medication Sig  . Acetaminophen 325 MG CAPS Take by mouth.  Marland Kitchen amLODipine (NORVASC) 5 MG tablet Take 1 tablet (5 mg total) by mouth daily.  Marland Kitchen amoxicillin (AMOXIL) 875 MG tablet Take  875 mg by mouth 2 (two) times daily.  Marland Kitchen. aspirin 81 MG chewable tablet Chew 1 tablet by mouth daily.  Marland Kitchen. atorvastatin (LIPITOR) 20 MG tablet Take 1 tablet (20 mg total) by mouth daily at 6 PM.  . Cholecalciferol (D3-50) 1.25 MG (50000 UT) capsule Take 1 capsule (50,000 Units total) by mouth once a week.  . diltiazem (CARDIZEM CD) 120 MG 24 hr capsule Take 1 capsule (120 mg  total) by mouth daily.  . furosemide (LASIX) 40 MG tablet Take 1 tablet by mouth daily.  Marland Kitchen. gabapentin (NEURONTIN) 100 MG capsule Take 3 capsules by mouth daily.   . hydrocortisone 2.5 % cream Apply topically.  . NON FORMULARY CPAP nightly  . omeprazole (PRILOSEC) 20 MG capsule Take 20 mg by mouth daily.    PHQ 2/9 Scores 03/25/2019 03/09/2019 09/07/2018 04/20/2018  PHQ - 2 Score 2 3 0 0  PHQ- 9 Score 2 11 - -    BP Readings from Last 3 Encounters:  03/25/19 128/80  03/09/19 126/82  09/07/18 116/64    Physical Exam Vitals signs and nursing note reviewed.  Constitutional:      General: He is not in acute distress.    Appearance: He is well-developed.  HENT:     Head: Normocephalic and atraumatic.  Neck:     Musculoskeletal: Normal range of motion.  Cardiovascular:     Rate and Rhythm: Normal rate and regular rhythm.     Pulses: Normal pulses.     Heart sounds: No murmur. No friction rub.  Pulmonary:     Effort: Pulmonary effort is normal. No respiratory distress.     Breath sounds: No wheezing or rhonchi.  Abdominal:     General: There is no distension.     Tenderness: There is abdominal tenderness (mild mid left sided tenderness).  Musculoskeletal: Normal range of motion.     Right lower leg: No edema.     Left lower leg: No edema.  Skin:    General: Skin is warm and dry.     Findings: No rash.  Neurological:     Mental Status: He is alert and oriented to person, place, and time.  Psychiatric:        Behavior: Behavior normal.        Thought Content: Thought content normal.     Wt Readings from Last 3 Encounters:  03/25/19 232 lb (105.2 kg)  03/09/19 233 lb (105.7 kg)  09/07/18 257 lb (116.6 kg)    BP 128/80   Pulse 68   Resp 16   Ht 5\' 10"  (1.778 m)   Wt 232 lb (105.2 kg)   SpO2 97%   BMI 33.29 kg/m   Assessment and Plan: .1. Community acquired pneumonia of right lower lobe of lung (HCC) Finish Augmentin Check CXR Return to work full time Right  sided pleurisy will resolve over time - DG Chest 2 View; Future  2. Atrial fibrillation, unspecified type (HCC) Continue Cardizem Pt declines aspirin  3. Essential hypertension Stop Amlodipine  4. Bright red rectal bleeding One episode now resolved If recurrent,  will need colonoscopy which pt has declined in the past   Partially dictated using Dragon software. Any errors are unintentional.  Bari EdwardLaura Tou Hayner, MD [facility]  03/25/2019

## 2019-03-25 NOTE — Patient Instructions (Signed)
Stop Amlodipine  Continue Cardiazem

## 2019-03-27 ENCOUNTER — Encounter: Payer: Self-pay | Admitting: Internal Medicine

## 2019-04-21 ENCOUNTER — Encounter: Payer: Self-pay | Admitting: Internal Medicine

## 2019-04-22 ENCOUNTER — Other Ambulatory Visit: Payer: Self-pay

## 2019-04-22 ENCOUNTER — Encounter: Payer: Self-pay | Admitting: Internal Medicine

## 2019-04-22 ENCOUNTER — Ambulatory Visit (INDEPENDENT_AMBULATORY_CARE_PROVIDER_SITE_OTHER): Payer: BLUE CROSS/BLUE SHIELD | Admitting: Internal Medicine

## 2019-04-22 ENCOUNTER — Ambulatory Visit
Admission: RE | Admit: 2019-04-22 | Discharge: 2019-04-22 | Disposition: A | Discharge status: Home / Self Care | Payer: BLUE CROSS/BLUE SHIELD | Source: Ambulatory Visit | Attending: Internal Medicine | Admitting: Internal Medicine

## 2019-04-22 ENCOUNTER — Ambulatory Visit
Admission: RE | Admit: 2019-04-22 | Discharge: 2019-04-22 | Disposition: A | Discharge status: Home / Self Care | Payer: BLUE CROSS/BLUE SHIELD | Attending: Internal Medicine | Admitting: Internal Medicine

## 2019-04-22 VITALS — BP 136/78 | HR 71 | Ht 70.0 in | Wt 239.0 lb

## 2019-04-22 DIAGNOSIS — J181 Lobar pneumonia, unspecified organism: Secondary | ICD-10-CM

## 2019-04-22 DIAGNOSIS — I10 Essential (primary) hypertension: Secondary | ICD-10-CM

## 2019-04-22 DIAGNOSIS — J189 Pneumonia, unspecified organism: Secondary | ICD-10-CM

## 2019-04-22 DIAGNOSIS — R1011 Right upper quadrant pain: Secondary | ICD-10-CM

## 2019-04-22 NOTE — Progress Notes (Signed)
Date:  04/22/2019   Name:  Chad Choi   DOB:  1955-11-28   MRN:  [id number]   Chief Complaint: Abdominal Pain (Yesterday stool was black. It was still dark today but not black. Not hurting bad now, because he is on pain medication on and off. No diarrhea. Pain on left side and right above navel.  )  Abdominal Pain This is a recurrent problem. The pain is located in the epigastric region. The quality of the pain is cramping and aching. The abdominal pain radiates to the epigastric region. Associated symptoms include belching. Pertinent negatives include no constipation, diarrhea, fever, flatus, headaches or hematochezia. He has tried proton pump inhibitors (pepto bismol did not help much) for the symptoms.  CAP - CXR a month ago showed partial clearing.  He returned to work mid June.  Recommendation for repeat CXR in one month.  RI - he had labs done yesterday by Nephrology to follow up from hospital stay.  Review of Systems  Constitutional: Negative for chills, diaphoresis, fever and unexpected weight change.  Respiratory: Negative for cough, chest tightness, shortness of breath and wheezing.   Cardiovascular: Negative for chest pain, palpitations and leg swelling.  Gastrointestinal: Positive for abdominal pain. Negative for blood in stool, constipation, diarrhea, flatus and hematochezia.  Neurological: Negative for dizziness and headaches.  Psychiatric/Behavioral: Negative for dysphoric mood and sleep disturbance. The patient is not nervous/anxious.     Patient Active Problem List   Diagnosis Date Noted  . Acute kidney failure, unspecified (HCC) 03/10/2019  . Vitamin D deficiency 03/09/2019  . Atrial fibrillation (HCC) 03/09/2019  . Community acquired pneumonia of right lower lobe of lung (HCC) 03/09/2019  . Severe obesity (HCC) 09/07/2018  . History of arthroplasty of right ankle 07/05/2018  . Degeneration of intervertebral disc of cervical region 04/20/2018  . Hyperlipidemia  04/20/2018  . Essential hypertension 04/20/2018  . Hx of adenomatous colonic polyps 04/20/2018  . OSA on CPAP 04/20/2018  . Primary osteoarthritis of both hands 06/06/2016  . Other age-related incipient cataract, bilateral 05/13/2016    Allergies  Allergen Reactions  . Naproxen Hives  . Sulfa Antibiotics Hives and Other (See Comments)    Past Surgical History:  Procedure Laterality Date  . TOTAL ANKLE REPLACEMENT Right 03/2018    Social History   Tobacco Use  . Smoking status: Former Smoker    Packs/day: 1.50    Years: 16.00    Pack years: 24.00    Types: Cigarettes    Quit date: 03/28/1987    Years since quitting: 32.0  . Smokeless tobacco: Former NeurosurgeonUser    Quit date: 1989  Substance Use Topics  . Alcohol use: Never    Frequency: Never  . Drug use: Never     Medication list has been reviewed and updated.  Current Meds  Medication Sig  . Acetaminophen 325 MG CAPS Take by mouth.  Marland Kitchen. amoxicillin (AMOXIL) 875 MG tablet Take 875 mg by mouth 2 (two) times daily.  Marland Kitchen. aspirin 81 MG chewable tablet Chew 1 tablet by mouth daily.  Marland Kitchen. atorvastatin (LIPITOR) 20 MG tablet Take 1 tablet (20 mg total) by mouth daily at 6 PM.  . Cholecalciferol (D3-50) 1.25 MG (50000 UT) capsule Take 1 capsule (50,000 Units total) by mouth once a week.  . diltiazem (CARDIZEM CD) 120 MG 24 hr capsule Take 1 capsule (120 mg total) by mouth daily.  . furosemide (LASIX) 40 MG tablet Take 1 tablet by mouth daily.  Marland Kitchen. gabapentin (  NEURONTIN) 100 MG capsule Take 3 capsules by mouth daily.   . hydrocortisone 2.5 % cream Apply topically.  . NON FORMULARY CPAP nightly  . omeprazole (PRILOSEC) 20 MG capsule Take 20 mg by mouth daily.    PHQ 2/9 Scores 04/22/2019 03/25/2019 03/09/2019 09/07/2018  PHQ - 2 Score 0 2 3 0  PHQ- 9 Score 0 2 11 -    BP Readings from Last 3 Encounters:  04/22/19 136/78  03/25/19 128/80  03/09/19 126/82    Physical Exam Vitals signs and nursing note reviewed.  Constitutional:       General: He is not in acute distress.    Appearance: He is well-developed.  HENT:     Head: Normocephalic and atraumatic.  Cardiovascular:     Rate and Rhythm: Normal rate and regular rhythm.  Pulmonary:     Effort: Pulmonary effort is normal. No respiratory distress.     Breath sounds: Normal breath sounds. No wheezing or rhonchi.  Abdominal:     General: Abdomen is protuberant. Bowel sounds are normal.     Palpations: Abdomen is soft. There is no hepatomegaly or splenomegaly.     Tenderness: There is abdominal tenderness in the right upper quadrant.  Musculoskeletal: Normal range of motion.  Skin:    General: Skin is warm and dry.     Capillary Refill: Capillary refill takes less than 2 seconds.     Findings: No rash.  Neurological:     Mental Status: He is alert and oriented to person, place, and time.  Psychiatric:        Behavior: Behavior normal.        Thought Content: Thought content normal.     Wt Readings from Last 3 Encounters:  04/22/19 239 lb (108.4 kg)  03/25/19 232 lb (105.2 kg)  03/09/19 233 lb (105.7 kg)    BP 136/78   Pulse 71   Ht 5\' 10"  (1.778 m)   Wt 239 lb (108.4 kg)   SpO2 96%   BMI 34.29 kg/m   Assessment and Plan: 1. Colicky RUQ abdominal pain Increase omeprazole to twice day - US Abdomen Limited RUQ; Future  2. Community acquired pneumonia of right lower lobe of lung ([place]) - DG Chest 2 View; Future  3. Essential hypertension controlled   Partially dictated using Editor, commissioning. Any errors are unintentional.  Halina Maidens, MD [facility]  04/22/2019

## 2019-04-24 ENCOUNTER — Encounter: Payer: Self-pay | Admitting: Internal Medicine

## 2019-04-25 NOTE — Progress Notes (Signed)
"  Persistent linear airspace opacity in the right lower lobe"  What does this mean? He understood everything else. Does this it will effect his lungs for life?  Please Advise.

## 2019-04-27 ENCOUNTER — Other Ambulatory Visit: Payer: Self-pay

## 2019-04-27 MED ORDER — DILTIAZEM HCL ER COATED BEADS 120 MG PO CP24: 120.0000 mg | ORAL_CAPSULE | Freq: Every day | ORAL | 1 refills | Status: DC

## 2019-04-29 DIAGNOSIS — R1011 Right upper quadrant pain: Secondary | ICD-10-CM | POA: Diagnosis not present

## 2019-05-03 ENCOUNTER — Other Ambulatory Visit: Payer: Self-pay

## 2019-05-03 MED ORDER — DILTIAZEM HCL ER COATED BEADS 120 MG PO CP24: 120.0000 mg | ORAL_CAPSULE | Freq: Every day | ORAL | 1 refills | Status: DC

## 2019-05-06 ENCOUNTER — Encounter: Payer: Self-pay | Admitting: Internal Medicine

## 2019-05-12 DIAGNOSIS — N179 Acute kidney failure, unspecified: Secondary | ICD-10-CM | POA: Diagnosis not present

## 2019-05-12 DIAGNOSIS — I10 Essential (primary) hypertension: Secondary | ICD-10-CM | POA: Diagnosis not present

## 2019-05-17 DIAGNOSIS — M5416 Radiculopathy, lumbar region: Secondary | ICD-10-CM | POA: Diagnosis not present

## 2019-05-17 DIAGNOSIS — Z79899 Other long term (current) drug therapy: Secondary | ICD-10-CM | POA: Diagnosis not present

## 2019-05-17 DIAGNOSIS — M791 Myalgia, unspecified site: Secondary | ICD-10-CM | POA: Diagnosis not present

## 2019-05-25 ENCOUNTER — Encounter: Payer: Self-pay | Admitting: Internal Medicine

## 2019-05-25 ENCOUNTER — Ambulatory Visit
Admission: RE | Admit: 2019-05-25 | Discharge: 2019-05-25 | Disposition: A | Discharge status: Home / Self Care | Payer: BLUE CROSS/BLUE SHIELD | Attending: Internal Medicine | Admitting: Internal Medicine

## 2019-05-25 ENCOUNTER — Other Ambulatory Visit: Payer: Self-pay

## 2019-05-25 ENCOUNTER — Ambulatory Visit (INDEPENDENT_AMBULATORY_CARE_PROVIDER_SITE_OTHER): Payer: BLUE CROSS/BLUE SHIELD | Admitting: Internal Medicine

## 2019-05-25 ENCOUNTER — Ambulatory Visit
Admission: RE | Admit: 2019-05-25 | Discharge: 2019-05-25 | Disposition: A | Discharge status: Home / Self Care | Payer: BLUE CROSS/BLUE SHIELD | Source: Ambulatory Visit | Attending: Internal Medicine | Admitting: Internal Medicine

## 2019-05-25 VITALS — BP 130/80 | HR 56 | Ht 70.0 in | Wt 243.0 lb

## 2019-05-25 DIAGNOSIS — J189 Pneumonia, unspecified organism: Secondary | ICD-10-CM

## 2019-05-25 DIAGNOSIS — I10 Essential (primary) hypertension: Secondary | ICD-10-CM

## 2019-05-25 DIAGNOSIS — I350 Nonrheumatic aortic (valve) stenosis: Secondary | ICD-10-CM

## 2019-05-25 DIAGNOSIS — N179 Acute kidney failure, unspecified: Secondary | ICD-10-CM | POA: Diagnosis not present

## 2019-05-25 DIAGNOSIS — J181 Lobar pneumonia, unspecified organism: Secondary | ICD-10-CM | POA: Diagnosis not present

## 2019-05-25 NOTE — Progress Notes (Signed)
Date:  05/25/2019   Name:  Chad Choi   DOB:  [birth year]   MRN:  [id number]   Chief Complaint: Pneumonia (Follow up chest XR) and Hypertension  Pneumonia There is no cough, shortness of breath or wheezing. Primary symptoms comments: All previous sx have resolved. The current episode started more than 1 month ago. The problem has been resolved. Pertinent negatives include no appetite change, chest pain, dyspnea on exertion, fever, headaches, malaise/fatigue, orthopnea or PND.  Hypertension This is a chronic problem. The problem has been gradually improving since onset. The problem is controlled. Pertinent negatives include no chest pain, headaches, malaise/fatigue, palpitations, PND or shortness of breath. Past treatments include calcium channel blockers and diuretics.  AKI - seen 2 weeks ago by Nephrology.  CR down to 1.2.  He was told to take advil only PRN and to remain hydrated. He was advised that he may need to resume lisinopril but he wanted to try low salt diet and weight loss first.  Since his last visit here he has gained 4 lbs. AS - he recently had an ECHO that showed mild AS and grade 1 diastolic dysfunction.  He is being referred to a vascular surgeon.  He denies significant shortness of breath but does feel that he is not breathing as easily as before his pneumonia.  Review of Systems  Constitutional: Negative for appetite change, diaphoresis, fatigue, fever, malaise/fatigue and unexpected weight change.  Respiratory: Negative for cough, chest tightness, shortness of breath and wheezing.   Cardiovascular: Negative for chest pain, dyspnea on exertion, palpitations, leg swelling and PND.  Gastrointestinal: Negative for abdominal pain and constipation.  Neurological: Negative for dizziness, light-headedness and headaches.  Psychiatric/Behavioral: Negative for sleep disturbance. The patient is not nervous/anxious.     Patient Active Problem List   Diagnosis Date Noted  .  Acute kidney failure, unspecified (HCC) 03/10/2019  . Vitamin D deficiency 03/09/2019  . Atrial fibrillation (HCC) 03/09/2019  . Community acquired pneumonia of right lower lobe of lung (HCC) 03/09/2019  . Severe obesity (HCC) 09/07/2018  . History of arthroplasty of right ankle 07/05/2018  . Degeneration of intervertebral disc of cervical region 04/20/2018  . Hyperlipidemia 04/20/2018  . Essential hypertension 04/20/2018  . Hx of adenomatous colonic polyps 04/20/2018  . OSA on CPAP 04/20/2018  . Primary osteoarthritis of both hands 06/06/2016  . Other age-related incipient cataract, bilateral 05/13/2016    Allergies  Allergen Reactions  . Naproxen Hives  . Sulfa Antibiotics Hives and Other (See Comments)    Past Surgical History:  Procedure Laterality Date  . TOTAL ANKLE REPLACEMENT Right 03/2018    Social History   Tobacco Use  . Smoking status: Former Smoker    Packs/day: 1.50    Years: 16.00    Pack years: 24.00    Types: Cigarettes    Quit date: 03/28/1987    Years since quitting: 32.1  . Smokeless tobacco: Former NeurosurgeonUser    Quit date: 1989  Substance Use Topics  . Alcohol use: Never    Frequency: Never  . Drug use: Never     Medication list has been reviewed and updated.  Current Meds  Medication Sig  . Acetaminophen 325 MG CAPS Take by mouth.  Marland Kitchen. aspirin 81 MG chewable tablet Chew 1 tablet by mouth daily.  Marland Kitchen. atorvastatin (LIPITOR) 20 MG tablet Take 1 tablet (20 mg total) by mouth daily at 6 PM.  . Cholecalciferol (D3-50) 1.25 MG (50000 UT) capsule Take 1 capsule (50,000  Units total) by mouth once a week.  . diltiazem (CARDIZEM CD) 120 MG 24 hr capsule Take 1 capsule (120 mg total) by mouth daily.  . furosemide (LASIX) 40 MG tablet Take 1 tablet by mouth daily.  Marland Kitchen gabapentin (NEURONTIN) 100 MG capsule Take 3 capsules by mouth daily.   . hydrocortisone 2.5 % cream Apply topically.  . NON FORMULARY CPAP nightly  . omeprazole (PRILOSEC) 20 MG capsule Take 20 mg  by mouth daily.    PHQ 2/9 Scores 05/25/2019 04/22/2019 03/25/2019 03/09/2019  PHQ - 2 Score 0 0 2 3  PHQ- 9 Score 0 0 2 11    BP Readings from Last 3 Encounters:  05/25/19 130/80  04/22/19 136/78  03/25/19 128/80    Physical Exam Vitals signs and nursing note reviewed.  Constitutional:      General: He is not in acute distress.    Appearance: Normal appearance. He is well-developed.  HENT:     Head: Normocephalic and atraumatic.  Neck:     Musculoskeletal: Normal range of motion.  Cardiovascular:     Rate and Rhythm: Normal rate and regular rhythm.     Pulses: Normal pulses.     Heart sounds: No murmur.  Pulmonary:     Effort: Pulmonary effort is normal. No respiratory distress.     Breath sounds: No wheezing or rhonchi.  Musculoskeletal: Normal range of motion.     Right lower leg: No edema.     Left lower leg: No edema.  Lymphadenopathy:     Cervical: No cervical adenopathy.  Skin:    General: Skin is warm and dry.     Capillary Refill: Capillary refill takes less than 2 seconds.     Findings: No rash.  Neurological:     General: No focal deficit present.     Mental Status: He is alert and oriented to person, place, and time.  Psychiatric:        Behavior: Behavior normal.        Thought Content: Thought content normal.     Wt Readings from Last 3 Encounters:  05/25/19 243 lb (110.2 kg)  04/22/19 239 lb (108.4 kg)  03/25/19 232 lb (105.2 kg)    BP 130/80   Pulse (!) 56   Ht 5\' 10"  (1.778 m)   Wt 243 lb (110.2 kg)   SpO2 96%   BMI 34.87 kg/m   Assessment and Plan: 1. Nonrheumatic aortic valve stenosis Mild without obvious sx at this time Recommend that he see VS for routine monitoring  2. Community acquired pneumonia of right lower lobe of lung ([place]) Resolved clinically but last follow up chest film was still not completely clear - DG Chest 2 View; Future  3. Acute renal failure, unspecified acute renal failure type [facility] Nephrology has released  him from follow up.  He was unable to get labs done last visit there so will do follow up today Exam stable with no edema, SOB, PND or orthpnea noted - Basic metabolic panel  4. Essential hypertension Exam normal with adequate control today Discussed ongoing diet changes/sodium restriction and exercise for weight loss and improved BP control Pt to get home cuff to monitor regularly Continue Cardiazem and lasix for now - add lisinopril if he does not remain controlled Follow up 3 months   Partially dictated using Editor, commissioning. Any errors are unintentional.  Halina Maidens, MD [facility]  05/25/2019

## 2019-05-26 ENCOUNTER — Encounter: Payer: Self-pay | Admitting: Internal Medicine

## 2019-05-26 DIAGNOSIS — I517 Cardiomegaly: Secondary | ICD-10-CM | POA: Diagnosis not present

## 2019-05-26 DIAGNOSIS — I77819 Aortic ectasia, unspecified site: Secondary | ICD-10-CM | POA: Diagnosis not present

## 2019-05-26 LAB — BASIC METABOLIC PANEL
BUN/Creatinine Ratio: 23 (ref 10–24)
BUN: 23 mg/dL (ref 8–27)
CO2: 26 mmol/L (ref 20–29)
Calcium: 9 mg/dL (ref 8.6–10.2)
Chloride: 103 mmol/L (ref 96–106)
Creatinine, Ser: 0.98 mg/dL (ref 0.76–1.27)
GFR calc Af Amer: 93 mL/min/{1.73_m2} (ref >59)
GFR calc non Af Amer: 81 mL/min/{1.73_m2} (ref >59)
Glucose: 95 mg/dL (ref 65–99)
Potassium: 4.5 mmol/L (ref 3.5–5.2)
Sodium: 142 mmol/L (ref 134–144)

## 2019-05-27 NOTE — Telephone Encounter (Signed)
Please advise. Shouldn't the [facility] be resulting this and ordering further testing if needed?

## 2019-05-28 NOTE — Telephone Encounter (Signed)
Yes, the [facility] should schedule the follow up.  I could do it but they would need to forward me the report before I can justify it.

## 2019-06-01 ENCOUNTER — Other Ambulatory Visit: Payer: Self-pay

## 2019-06-01 DIAGNOSIS — E782 Mixed hyperlipidemia: Secondary | ICD-10-CM

## 2019-06-01 MED ORDER — ATORVASTATIN CALCIUM 20 MG PO TABS: 20.0000 mg | ORAL_TABLET | Freq: Every day | ORAL | 1 refills | Status: DC

## 2019-06-02 DIAGNOSIS — M25571 Pain in right ankle and joints of right foot: Secondary | ICD-10-CM | POA: Diagnosis not present

## 2019-06-16 DIAGNOSIS — E785 Hyperlipidemia, unspecified: Secondary | ICD-10-CM | POA: Diagnosis not present

## 2019-06-16 DIAGNOSIS — R0789 Other chest pain: Secondary | ICD-10-CM | POA: Diagnosis not present

## 2019-06-16 DIAGNOSIS — I712 Thoracic aortic aneurysm, without rupture: Secondary | ICD-10-CM | POA: Diagnosis not present

## 2019-06-16 DIAGNOSIS — I10 Essential (primary) hypertension: Secondary | ICD-10-CM | POA: Diagnosis not present

## 2019-06-29 DIAGNOSIS — D2260 Melanocytic nevi of unspecified upper limb, including shoulder: Secondary | ICD-10-CM | POA: Diagnosis not present

## 2019-06-29 DIAGNOSIS — D225 Melanocytic nevi of trunk: Secondary | ICD-10-CM | POA: Diagnosis not present

## 2019-06-29 DIAGNOSIS — L820 Inflamed seborrheic keratosis: Secondary | ICD-10-CM | POA: Diagnosis not present

## 2019-06-29 DIAGNOSIS — D2230 Melanocytic nevi of unspecified part of face: Secondary | ICD-10-CM | POA: Diagnosis not present

## 2019-06-29 DIAGNOSIS — D224 Melanocytic nevi of scalp and neck: Secondary | ICD-10-CM | POA: Diagnosis not present

## 2019-08-07 ENCOUNTER — Encounter: Payer: Self-pay | Admitting: Internal Medicine

## 2019-08-08 ENCOUNTER — Encounter: Payer: Self-pay | Admitting: Internal Medicine

## 2019-08-08 ENCOUNTER — Ambulatory Visit: Payer: BLUE CROSS/BLUE SHIELD | Admitting: Internal Medicine

## 2019-08-08 ENCOUNTER — Other Ambulatory Visit: Payer: Self-pay

## 2019-08-08 VITALS — BP 138/84 | HR 61 | Ht 70.0 in | Wt 246.0 lb

## 2019-08-08 DIAGNOSIS — G43009 Migraine without aura, not intractable, without status migrainosus: Secondary | ICD-10-CM | POA: Diagnosis not present

## 2019-08-08 DIAGNOSIS — Z6835 Body mass index (BMI) 35.0-35.9, adult: Secondary | ICD-10-CM | POA: Diagnosis not present

## 2019-08-08 DIAGNOSIS — I10 Essential (primary) hypertension: Secondary | ICD-10-CM | POA: Diagnosis not present

## 2019-08-08 NOTE — Progress Notes (Signed)
Date:  08/08/2019   Name:  Chad Choi   DOB:  [birth year]   MRN:  [id number]   Chief Complaint: Hypertension (Woke up with "bad headache" yesterday morning and last night and checked BP. It was 172/98. )  Headache  This is a new problem. The current episode started yesterday. The pain is located in the left unilateral and retro-orbital region. The pain does not radiate. Similar to prior headaches: mild form of migraines that he had as a child from age 63 to about 56 y/o. The quality of the pain is described as throbbing and squeezing. Associated symptoms include nausea and photophobia. Pertinent negatives include no blurred vision, coughing, dizziness, eye redness, eye watering, numbness, phonophobia, scalp tenderness, vomiting or weakness. Treatments tried: acetaminophen and ambien. The treatment provided moderate relief.    Review of Systems  Eyes: Positive for photophobia. Negative for blurred vision and redness.  Respiratory: Negative for cough, chest tightness, shortness of breath and wheezing.   Cardiovascular: Negative for chest pain and palpitations.  Gastrointestinal: Positive for nausea. Negative for vomiting.  Neurological: Positive for headaches. Negative for dizziness, tremors, syncope, weakness, light-headedness and numbness.  Psychiatric/Behavioral: Negative for confusion and dysphoric mood. The patient is not nervous/anxious.     Patient Active Problem List   Diagnosis Date Noted  . Aortic stenosis 05/25/2019  . Vitamin D deficiency 03/09/2019  . History of atrial fibrillation 03/09/2019  . Community acquired pneumonia of right lower lobe of lung 03/09/2019  . Severe obesity ([place]) 09/07/2018  . History of arthroplasty of right ankle 07/05/2018  . Degeneration of intervertebral disc of cervical region 04/20/2018  . Hyperlipidemia 04/20/2018  . Essential hypertension 04/20/2018  . Hx of adenomatous colonic polyps 04/20/2018  . OSA on CPAP 04/20/2018  . Primary  osteoarthritis of both hands 06/06/2016  . Other age-related incipient cataract, bilateral 05/13/2016    Allergies  Allergen Reactions  . Naproxen Hives  . Sulfa Antibiotics Hives and Other (See Comments)    Past Surgical History:  Procedure Laterality Date  . TOTAL ANKLE REPLACEMENT Right 03/2018    Social History   Tobacco Use  . Smoking status: Former Smoker    Packs/day: 1.50    Years: 16.00    Pack years: 24.00    Types: Cigarettes    Quit date: 03/28/1987    Years since quitting: 32.3  . Smokeless tobacco: Former Systems developer    Quit date: 1989  Substance Use Topics  . Alcohol use: Never    Frequency: Never  . Drug use: Never     Medication list has been reviewed and updated.  Current Meds  Medication Sig  . Acetaminophen 325 MG CAPS Take by mouth.  Marland Kitchen atorvastatin (LIPITOR) 20 MG tablet Take 1 tablet (20 mg total) by mouth daily at 6 PM.  . Cholecalciferol (D3-50) 1.25 MG (50000 UT) capsule Take 1 capsule (50,000 Units total) by mouth once a week.  . diltiazem (CARDIZEM CD) 120 MG 24 hr capsule Take 1 capsule (120 mg total) by mouth daily.  Marland Kitchen gabapentin (NEURONTIN) 100 MG capsule Take 100 mg by mouth daily.   . hydrocortisone 2.5 % cream Apply topically.  . NON FORMULARY CPAP nightly  . omeprazole (PRILOSEC) 20 MG capsule Take 20 mg by mouth daily.    PHQ 2/9 Scores 08/08/2019 05/25/2019 04/22/2019 03/25/2019  PHQ - 2 Score 0 0 0 2  PHQ- 9 Score 4 0 0 2    BP Readings from Last 3 Encounters:  08/08/19  138/84  05/25/19 130/80  04/22/19 136/78    Physical Exam Vitals signs and nursing note reviewed.  Constitutional:      General: He is not in acute distress.    Appearance: He is well-developed.  HENT:     Head: Normocephalic and atraumatic.  Eyes:     Extraocular Movements: Extraocular movements intact.     Right eye: Normal extraocular motion and no nystagmus.     Left eye: Normal extraocular motion and no nystagmus.     Conjunctiva/sclera: Conjunctivae  normal.  Neck:     Musculoskeletal: Normal range of motion and neck supple.  Cardiovascular:     Rate and Rhythm: Normal rate and regular rhythm.     Pulses: Normal pulses.     Heart sounds: No murmur.  Pulmonary:     Effort: Pulmonary effort is normal. No respiratory distress.     Breath sounds: No wheezing or rhonchi.  Musculoskeletal:     Right lower leg: No edema.     Left lower leg: No edema.  Lymphadenopathy:     Cervical: No cervical adenopathy.  Skin:    General: Skin is warm and dry.     Findings: No rash.  Neurological:     General: No focal deficit present.     Mental Status: He is alert and oriented to person, place, and time.     Motor: Motor function is intact.  Psychiatric:        Behavior: Behavior normal.        Thought Content: Thought content normal.     Wt Readings from Last 3 Encounters:  08/08/19 246 lb (111.6 kg)  05/25/19 243 lb (110.2 kg)  04/22/19 239 lb (108.4 kg)    BP 138/84 (BP Location: Right Arm, Patient Position: Sitting, Cuff Size: Large)   Pulse 61   Ht 5\' 10"  (1.778 m)   Wt 246 lb (111.6 kg)   SpO2 97%   BMI 35.30 kg/m   Assessment and Plan: 1. Migraine without aura and without status migrainosus, not intractable Suspect recurrence of migraine headaches Recommend taking Excedrin Migraine at headache onset if needed Return if worsening  2. Essential hypertension Clinically stable exam with well controlled BP.   Tolerating medications, cardiazem, without side effects at this time. Pt to continue current regimen and low sodium diet; benefits of regular exercise as able discussed.   3. BMI 35.0-35.9,adult Pt encouraged to work on diet and exercise   Partially dictated using 03-30-1979. Any errors are unintentional.  Animal nutritionist, MD [facility]  08/08/2019

## 2019-08-08 NOTE — Patient Instructions (Signed)
Excedrin Migraine - take as directed at onset of headache  Migraine Headache A migraine headache is an intense, throbbing pain on one side or both sides of the head. Migraine headaches may also cause other symptoms, such as nausea, vomiting, and sensitivity to light and noise. A migraine headache can last from 4 hours to 3 days. Talk with your doctor about what things may bring on (trigger) your migraine headaches. What are the causes? The exact cause of this condition is not known. However, a migraine may be caused when nerves in the brain become irritated and release chemicals that cause inflammation of blood vessels. This inflammation causes pain. This condition may be triggered or caused by:  Drinking alcohol.  Smoking.  Taking medicines, such as: ? Medicine used to treat chest pain (nitroglycerin). ? Birth control pills. ? Estrogen. ? Certain blood pressure medicines.  Eating or drinking products that contain nitrates, glutamate, aspartame, or tyramine. Aged cheeses, chocolate, or caffeine may also be triggers.  Doing physical activity. Other things that may trigger a migraine headache include:  Menstruation.  Pregnancy.  Hunger.  Stress.  Lack of sleep or too much sleep.  Weather changes.  Fatigue. What increases the risk? The following factors may make you more likely to experience migraine headaches:  Being a certain age. This condition is more common in people who are 67-67 years old.  Being male.  Having a family history of migraine headaches.  Being Caucasian.  Having a mental health condition, such as depression or anxiety.  Being obese. What are the signs or symptoms? The main symptom of this condition is pulsating or throbbing pain. This pain may:  Happen in any area of the head, such as on one side or both sides.  Interfere with daily activities.  Get worse with physical activity.  Get worse with exposure to bright lights or loud noises.  Other symptoms may include:  Nausea.  Vomiting.  Dizziness.  General sensitivity to bright lights, loud noises, or smells. Before you get a migraine headache, you may get warning signs (an aura). An aura may include:  Seeing flashing lights or having blind spots.  Seeing bright spots, halos, or zigzag lines.  Having tunnel vision or blurred vision.  Having numbness or a tingling feeling.  Having trouble talking.  Having muscle weakness. Some people have symptoms after a migraine headache (postdromal phase), such as:  Feeling tired.  Difficulty concentrating. How is this diagnosed? A migraine headache can be diagnosed based on:  Your symptoms.  A physical exam.  Tests, such as: ? CT scan or an MRI of the head. These imaging tests can help rule out other causes of headaches. ? Taking fluid from the spine (lumbar puncture) and analyzing it (cerebrospinal fluid analysis, or CSF analysis). How is this treated? This condition may be treated with medicines that:  Relieve pain.  Relieve nausea.  Prevent migraine headaches. Treatment for this condition may also include:  Acupuncture.  Lifestyle changes like avoiding foods that trigger migraine headaches.  Biofeedback.  Cognitive behavioral therapy. Follow these instructions at home: Medicines  Take over-the-counter and prescription medicines only as told by your health care provider.  Ask your health care provider if the medicine prescribed to you: ? Requires you to avoid driving or using heavy machinery. ? Can cause constipation. You may need to take these actions to prevent or treat constipation:  Drink enough fluid to keep your urine pale yellow.  Take over-the-counter or prescription medicines.  Eat foods  that are high in fiber, such as beans, whole grains, and fresh fruits and vegetables.  Limit foods that are high in fat and processed sugars, such as fried or sweet foods. Lifestyle  Do not drink  alcohol.  Do not use any products that contain nicotine or tobacco, such as cigarettes, e-cigarettes, and chewing tobacco. If you need help quitting, ask your health care provider.  Get at least 8 hours of sleep every night.  Find ways to manage stress, such as meditation, deep breathing, or yoga. General instructions      Keep a journal to find out what may trigger your migraine headaches. For example, write down: ? What you eat and drink. ? How much sleep you get. ? Any change to your diet or medicines.  If you have a migraine headache: ? Avoid things that make your symptoms worse, such as bright lights. ? It may help to lie down in a dark, quiet room. ? Do not drive or use heavy machinery. ? Ask your health care provider what activities are safe for you while you are experiencing symptoms.  Keep all follow-up visits as told by your health care provider. This is important. Contact a health care provider if:  You develop symptoms that are different or more severe than your usual migraine headache symptoms.  You have more than 15 headache days in one month. Get help right away if:  Your migraine headache becomes severe.  Your migraine headache lasts longer than 72 hours.  You have a fever.  You have a stiff neck.  You have vision loss.  Your muscles feel weak or like you cannot control them.  You start to lose your balance often.  You have trouble walking.  You faint.  You have a seizure. Summary  A migraine headache is an intense, throbbing pain on one side or both sides of the head. Migraines may also cause other symptoms, such as nausea, vomiting, and sensitivity to light and noise.  This condition may be treated with medicines and lifestyle changes. You may also need to avoid certain things that trigger a migraine headache.  Keep a journal to find out what may trigger your migraine headaches.  Contact your health care provider if you have more than 15  headache days in a month or you develop symptoms that are different or more severe than your usual migraine headache symptoms. This information is not intended to replace advice given to you by your health care provider. Make sure you discuss any questions you have with your health care provider. Document Released: 09/29/2005 Document Revised: 01/21/2019 Document Reviewed: 11/11/2018 Elsevier Patient Education  2020 [employer].

## 2019-08-10 ENCOUNTER — Ambulatory Visit: Payer: BLUE CROSS/BLUE SHIELD | Admitting: Internal Medicine

## 2019-09-12 ENCOUNTER — Encounter: Payer: BLUE CROSS/BLUE SHIELD | Admitting: Internal Medicine

## 2019-09-13 DIAGNOSIS — L600 Ingrowing nail: Secondary | ICD-10-CM | POA: Diagnosis not present

## 2019-09-13 DIAGNOSIS — L03031 Cellulitis of right toe: Secondary | ICD-10-CM | POA: Diagnosis not present

## 2019-09-13 DIAGNOSIS — M546 Pain in thoracic spine: Secondary | ICD-10-CM | POA: Diagnosis not present

## 2019-09-13 DIAGNOSIS — G894 Chronic pain syndrome: Secondary | ICD-10-CM | POA: Diagnosis not present

## 2019-09-13 DIAGNOSIS — M791 Myalgia, unspecified site: Secondary | ICD-10-CM | POA: Diagnosis not present

## 2019-09-13 DIAGNOSIS — Z79899 Other long term (current) drug therapy: Secondary | ICD-10-CM | POA: Diagnosis not present

## 2019-09-13 DIAGNOSIS — Z5181 Encounter for therapeutic drug level monitoring: Secondary | ICD-10-CM | POA: Diagnosis not present

## 2019-09-13 DIAGNOSIS — M545 Low back pain: Secondary | ICD-10-CM | POA: Diagnosis not present

## 2019-09-13 DIAGNOSIS — R071 Chest pain on breathing: Secondary | ICD-10-CM | POA: Diagnosis not present

## 2019-09-15 DIAGNOSIS — M71579 Other bursitis, not elsewhere classified, unspecified ankle and foot: Secondary | ICD-10-CM | POA: Diagnosis not present

## 2019-09-28 ENCOUNTER — Encounter: Payer: BLUE CROSS/BLUE SHIELD | Admitting: Internal Medicine

## 2019-09-28 ENCOUNTER — Encounter: Payer: Self-pay | Admitting: Internal Medicine

## 2019-09-28 NOTE — Telephone Encounter (Signed)
Please call the patient and reschedule his appt for this morning. Thank you.

## 2019-09-29 DIAGNOSIS — L600 Ingrowing nail: Secondary | ICD-10-CM | POA: Diagnosis not present

## 2019-10-13 ENCOUNTER — Other Ambulatory Visit: Payer: Self-pay | Admitting: Internal Medicine

## 2019-10-18 DIAGNOSIS — L600 Ingrowing nail: Secondary | ICD-10-CM | POA: Diagnosis not present

## 2019-10-25 DIAGNOSIS — D224 Melanocytic nevi of scalp and neck: Secondary | ICD-10-CM | POA: Diagnosis not present

## 2019-10-25 DIAGNOSIS — D225 Melanocytic nevi of trunk: Secondary | ICD-10-CM | POA: Diagnosis not present

## 2019-10-25 DIAGNOSIS — D2230 Melanocytic nevi of unspecified part of face: Secondary | ICD-10-CM | POA: Diagnosis not present

## 2019-10-25 DIAGNOSIS — D485 Neoplasm of uncertain behavior of skin: Secondary | ICD-10-CM | POA: Diagnosis not present

## 2019-11-25 ENCOUNTER — Other Ambulatory Visit: Payer: Self-pay

## 2019-11-25 ENCOUNTER — Encounter: Payer: Self-pay | Admitting: Internal Medicine

## 2019-11-25 ENCOUNTER — Ambulatory Visit (INDEPENDENT_AMBULATORY_CARE_PROVIDER_SITE_OTHER): Payer: BC Managed Care – PPO | Admitting: Internal Medicine

## 2019-11-25 VITALS — BP 132/85 | HR 61 | Temp 98.0°F | Ht 70.0 in | Wt 247.0 lb

## 2019-11-25 DIAGNOSIS — E559 Vitamin D deficiency, unspecified: Secondary | ICD-10-CM

## 2019-11-25 DIAGNOSIS — Z0000 Encounter for general adult medical examination without abnormal findings: Secondary | ICD-10-CM | POA: Diagnosis not present

## 2019-11-25 DIAGNOSIS — Z125 Encounter for screening for malignant neoplasm of prostate: Secondary | ICD-10-CM | POA: Diagnosis not present

## 2019-11-25 DIAGNOSIS — E782 Mixed hyperlipidemia: Secondary | ICD-10-CM

## 2019-11-25 DIAGNOSIS — Z1211 Encounter for screening for malignant neoplasm of colon: Secondary | ICD-10-CM

## 2019-11-25 DIAGNOSIS — Z9989 Dependence on other enabling machines and devices: Secondary | ICD-10-CM

## 2019-11-25 DIAGNOSIS — F321 Major depressive disorder, single episode, moderate: Secondary | ICD-10-CM | POA: Insufficient documentation

## 2019-11-25 DIAGNOSIS — G4733 Obstructive sleep apnea (adult) (pediatric): Secondary | ICD-10-CM | POA: Diagnosis not present

## 2019-11-25 DIAGNOSIS — I10 Essential (primary) hypertension: Secondary | ICD-10-CM | POA: Diagnosis not present

## 2019-11-25 LAB — POCT URINALYSIS DIPSTICK
Bilirubin, UA: NEGATIVE
Glucose, UA: NEGATIVE
Ketones, UA: NEGATIVE
Leukocytes, UA: NEGATIVE
Nitrite, UA: NEGATIVE
Protein, UA: NEGATIVE
Spec Grav, UA: 1.01 (ref 1.010–1.025)
Urobilinogen, UA: 0.2 E.U./dL
pH, UA: 6.5 (ref 5.0–8.0)

## 2019-11-25 MED ORDER — ESCITALOPRAM OXALATE 10 MG PO TABS: 10.0000 mg | ORAL_TABLET | Freq: Every day | ORAL | 1 refills | Status: DC

## 2019-11-25 NOTE — Progress Notes (Signed)
Date:  11/25/2019   Name:  Chad Choi   DOB:  Feb 21, 1956   MRN:  [id number]   Chief Complaint: Annual Exam, Depression (PHQ9- 12 Wants to discuss antidepressant. ), and Back Pain (Right lower back pain. Hurts worse when sitting. Heating pad only give short term pain relief. Started Wednesday- 2 days ago.) Chad Choi is a 64 y.o. male who presents today for his Complete Annual Exam. He feels fairly well. He reports exercising none but still working. He reports he is sleeping fairly well.   Colonoscopy - none Immunization History  Administered Date(s) Administered  . Influenza-Unspecified 07/02/2017, 07/07/2018, 06/24/2019  . PFIZER SARS-COV-2 Vaccination 11/03/2019, 11/24/2019  . Pneumococcal Conjugate-13 06/04/2015  . Pneumococcal Polysaccharide-23 02/04/2014  . Tdap 09/07/2018    Depression        This is a chronic problem.  The onset quality is undetermined. The problem is unchanged.  Associated symptoms include decreased concentration, hopelessness, insomnia, decreased interest, appetite change and sad.  Associated symptoms include no fatigue, not irritable, no myalgias, no headaches and no suicidal ideas.  Past treatments include nothing. Back Pain This is a new problem. The current episode started yesterday. The pain is present in the lumbar spine. Pertinent negatives include no abdominal pain, chest pain, dysuria or headaches.  Hypertension This is a chronic problem. The problem is controlled. Pertinent negatives include no chest pain, headaches, palpitations or shortness of breath. Past treatments include calcium channel blockers. The current treatment provides significant improvement.  Hyperlipidemia This is a chronic problem. The problem is controlled. Pertinent negatives include no chest pain, myalgias or shortness of breath. Current antihyperlipidemic treatment includes statins. The current treatment provides significant improvement of lipids.    Lab Results   Component Value Date   CREATININE 0.98 05/25/2019   BUN 23 05/25/2019   NA 142 05/25/2019   K 4.5 05/25/2019   CL 103 05/25/2019   CO2 26 05/25/2019   Lab Results  Component Value Date   CHOL 139 09/07/2018   HDL 42 09/07/2018   LDLCALC 74 09/07/2018   TRIG 114 09/07/2018   CHOLHDL 3.3 09/07/2018   No results found for: TSH No results found for: HGBA1C   Review of Systems  Constitutional: Positive for appetite change. Negative for chills, diaphoresis, fatigue and unexpected weight change.  HENT: Negative for hearing loss, tinnitus, trouble swallowing and voice change.   Eyes: Negative for visual disturbance.  Respiratory: Negative for choking, shortness of breath and wheezing.   Cardiovascular: Negative for chest pain, palpitations and leg swelling.  Gastrointestinal: Negative for abdominal pain, blood in stool, constipation and diarrhea.       Gerd  Genitourinary: Negative for difficulty urinating, dysuria and frequency.  Musculoskeletal: Positive for arthralgias and back pain. Negative for myalgias.  Skin: Negative for color change and rash.  Allergic/Immunologic: Negative for environmental allergies.  Neurological: Negative for dizziness, syncope and headaches.  Hematological: Negative for adenopathy.  Psychiatric/Behavioral: Positive for decreased concentration, depression and sleep disturbance. Negative for dysphoric mood and suicidal ideas. The patient has insomnia. The patient is not nervous/anxious.     Patient Active Problem List   Diagnosis Date Noted  . Migraine without aura and without status migrainosus, not intractable 08/08/2019  . Aortic stenosis 05/25/2019  . Vitamin D deficiency 03/09/2019  . History of atrial fibrillation 03/09/2019  . Community acquired pneumonia of right lower lobe of lung 03/09/2019  . Severe obesity ([place]) 09/07/2018  . History of arthroplasty of right ankle 07/05/2018  .  Degeneration of intervertebral disc of cervical region  04/20/2018  . Hyperlipidemia 04/20/2018  . Essential hypertension 04/20/2018  . Hx of adenomatous colonic polyps 04/20/2018  . OSA on CPAP 04/20/2018  . Primary osteoarthritis of both hands 06/06/2016  . Other age-related incipient cataract, bilateral 05/13/2016    Allergies  Allergen Reactions  . Naproxen Hives  . Sulfa Antibiotics Hives and Other (See Comments)    Past Surgical History:  Procedure Laterality Date  . TOTAL ANKLE REPLACEMENT Right 03/2018    Social History   Tobacco Use  . Smoking status: Former Smoker    Packs/day: 1.50    Years: 16.00    Pack years: 24.00    Types: Cigarettes    Quit date: 03/28/1987    Years since quitting: 32.6  . Smokeless tobacco: Former Neurosurgeon    Quit date: 1989  Substance Use Topics  . Alcohol use: Never  . Drug use: Never     Medication list has been reviewed and updated.  Current Meds  Medication Sig  . Acetaminophen 325 MG CAPS Take by mouth.  Marland Kitchen atorvastatin (LIPITOR) 20 MG tablet Take 1 tablet (20 mg total) by mouth daily at 6 PM.  . Cholecalciferol (D3-50) 1.25 MG (50000 UT) capsule Take 1 capsule (50,000 Units total) by mouth once a week.  . diltiazem (CARDIZEM CD) 120 MG 24 hr capsule TAKE 1 CAPSULE BY MOUTH EVERY DAY  . gabapentin (NEURONTIN) 100 MG capsule Take 100 mg by mouth daily.   . hydrocortisone 2.5 % cream Apply topically.  . NON FORMULARY CPAP nightly  . omeprazole (PRILOSEC) 20 MG capsule Take 20 mg by mouth daily.    PHQ 2/9 Scores 11/25/2019 08/08/2019 05/25/2019 04/22/2019  PHQ - 2 Score 4 0 0 0  PHQ- 9 Score 12 4 0 0    BP Readings from Last 3 Encounters:  11/25/19 132/85  08/08/19 138/84  05/25/19 130/80    Physical Exam Vitals and nursing note reviewed.  Constitutional:      General: He is not irritable.    Appearance: Normal appearance. He is well-developed.  HENT:     Head: Normocephalic.     Right Ear: Tympanic membrane, ear canal and external ear normal.     Left Ear: Tympanic  membrane, ear canal and external ear normal.     Nose: Nose normal.     Mouth/Throat:     Pharynx: Uvula midline.  Eyes:     Conjunctiva/sclera: Conjunctivae normal.     Pupils: Pupils are equal, round, and reactive to light.  Neck:     Thyroid: No thyromegaly.     Vascular: No carotid bruit.  Cardiovascular:     Rate and Rhythm: Normal rate and regular rhythm.     Heart sounds: Normal heart sounds.  Pulmonary:     Effort: Pulmonary effort is normal.     Breath sounds: Normal breath sounds. No wheezing.  Chest:     Breasts:        Right: No mass.        Left: No mass.  Abdominal:     General: Bowel sounds are normal.     Palpations: Abdomen is soft.     Tenderness: There is no abdominal tenderness.  Musculoskeletal:        General: Normal range of motion.     Cervical back: Normal range of motion and neck supple.     Lumbar back: No bony tenderness. Negative right straight leg raise test and negative left  straight leg raise test.     Right lower leg: No edema.     Left lower leg: No edema.  Lymphadenopathy:     Cervical: No cervical adenopathy.  Skin:    General: Skin is warm and dry.     Capillary Refill: Capillary refill takes less than 2 seconds.  Neurological:     General: No focal deficit present.     Mental Status: He is alert and oriented to person, place, and time.     Deep Tendon Reflexes: Reflexes are normal and symmetric.  Psychiatric:        Attention and Perception: Attention normal.        Mood and Affect: Mood is depressed.        Speech: Speech normal.        Behavior: Behavior normal.        Thought Content: Thought content normal. Thought content does not include suicidal ideation. Thought content does not include suicidal plan.        Cognition and Memory: Cognition normal.        Judgment: Judgment normal.     Wt Readings from Last 3 Encounters:  11/25/19 247 lb (112 kg)  08/08/19 246 lb (111.6 kg)  05/25/19 243 lb (110.2 kg)    BP 132/85    Pulse 61   Temp 98 F (36.7 C) (Oral)   Ht 5\' 10"  (1.778 m)   Wt 247 lb (112 kg)   SpO2 99%   BMI 35.44 kg/m   Assessment and Plan: 1. Annual physical exam Low back pain is new and may be due to recent vaccine Otherwise, take tylenol or advil as needed Follow up with Orthopedics - POCT urinalysis dipstick  2. Essential hypertension Clinically stable exam with well controlled BP on cardizem. Tolerating medications without side effects at this time. Pt to continue current regimen and low sodium diet; benefits of regular exercise as able discussed. - CBC with Differential/Platelet  3. Mixed hyperlipidemia Tolerating statin medication without side effects at this time Continue same therapy without change at this time. - Comprehensive metabolic panel - Lipid panel  4. Current moderate episode of major depressive disorder without prior episode (HCC) Clinically depressed but no SI/HI.  Worsening since his mothers recent death Begin medication and follow up in 4 weeks - escitalopram (LEXAPRO) 10 MG tablet; Take 1 tablet (10 mg total) by mouth daily.  Dispense: 30 tablet; Refill: 1 - TSH  5. OSA on CPAP Good compliance but not resting well due to depressive sx  6. Vitamin D deficiency On high dose weekly - will check labs - VITAMIN D 25 Hydroxy (Vit-D Deficiency, Fractures)  7. Prostate cancer screening DRE deferred to lack of symptoms - PSA  8. Colon cancer screening Has seen Dr. in the past but she retired; he would go the same practive - Ambulatory referral to Gastroenterology   Partially dictated using Warnell Forester. Any errors are unintentional.  Animal nutritionist, MD [facility]  11/25/2019

## 2019-11-26 LAB — PSA: Prostate Specific Ag, Serum: 0.9 ng/mL (ref 0.0–4.0)

## 2019-11-26 LAB — CBC WITH DIFFERENTIAL/PLATELET
Basophils Absolute: 0 10*3/uL (ref 0.0–0.2)
Basos: 1 %
EOS (ABSOLUTE): 0.2 10*3/uL (ref 0.0–0.4)
Eos: 3 %
Hematocrit: 47.2 % (ref 37.5–51.0)
Hemoglobin: 15.8 g/dL (ref 13.0–17.7)
Immature Grans (Abs): 0 10*3/uL (ref 0.0–0.1)
Immature Granulocytes: 0 %
Lymphocytes Absolute: 1.1 10*3/uL (ref 0.7–3.1)
Lymphs: 21 %
MCH: 28 pg (ref 26.6–33.0)
MCHC: 33.5 g/dL (ref 31.5–35.7)
MCV: 84 fL (ref 79–97)
Monocytes Absolute: 0.5 10*3/uL (ref 0.1–0.9)
Monocytes: 10 %
Neutrophils Absolute: 3.3 10*3/uL (ref 1.4–7.0)
Neutrophils: 65 %
Platelets: 233 10*3/uL (ref 150–450)
RBC: 5.64 x10E6/uL (ref 4.14–5.80)
RDW: 12.7 % (ref 11.6–15.4)
WBC: 5.1 10*3/uL (ref 3.4–10.8)

## 2019-11-26 LAB — COMPREHENSIVE METABOLIC PANEL
ALT: 24 IU/L (ref 0–44)
AST: 22 IU/L (ref 0–40)
Albumin/Globulin Ratio: 1.8 (ref 1.2–2.2)
Albumin: 4.4 g/dL (ref 3.8–4.8)
Alkaline Phosphatase: 77 IU/L (ref 39–117)
BUN/Creatinine Ratio: 15 (ref 10–24)
BUN: 14 mg/dL (ref 8–27)
Bilirubin Total: 0.9 mg/dL (ref 0.0–1.2)
CO2: 23 mmol/L (ref 20–29)
Calcium: 9.4 mg/dL (ref 8.6–10.2)
Chloride: 102 mmol/L (ref 96–106)
Creatinine, Ser: 0.94 mg/dL (ref 0.76–1.27)
GFR calc Af Amer: 98 mL/min/{1.73_m2} (ref >59)
GFR calc non Af Amer: 85 mL/min/{1.73_m2} (ref >59)
Globulin, Total: 2.5 g/dL (ref 1.5–4.5)
Glucose: 88 mg/dL (ref 65–99)
Potassium: 4.5 mmol/L (ref 3.5–5.2)
Sodium: 142 mmol/L (ref 134–144)
Total Protein: 6.9 g/dL (ref 6.0–8.5)

## 2019-11-26 LAB — LIPID PANEL
Chol/HDL Ratio: 3.6 ratio (ref 0.0–5.0)
Cholesterol, Total: 154 mg/dL (ref 100–199)
HDL: 43 mg/dL (ref >39)
LDL Chol Calc (NIH): 85 mg/dL (ref 0–99)
Triglycerides: 152 mg/dL — ABNORMAL HIGH (ref 0–149)
VLDL Cholesterol Cal: 26 mg/dL (ref 5–40)

## 2019-11-26 LAB — VITAMIN D 25 HYDROXY (VIT D DEFICIENCY, FRACTURES): Vit D, 25-Hydroxy: 33.1 ng/mL (ref 30.0–100.0)

## 2019-11-26 LAB — TSH: TSH: 0.683 u[IU]/mL (ref 0.450–4.500)

## 2019-11-29 ENCOUNTER — Encounter: Payer: Self-pay | Admitting: Internal Medicine

## 2019-11-30 NOTE — Telephone Encounter (Signed)
Pt is still complaining of BP being elevated at home. Please advise.

## 2019-12-06 ENCOUNTER — Encounter: Payer: Self-pay | Admitting: Internal Medicine

## 2019-12-06 ENCOUNTER — Ambulatory Visit: Payer: BC Managed Care – PPO | Admitting: Internal Medicine

## 2019-12-06 ENCOUNTER — Other Ambulatory Visit: Payer: Self-pay

## 2019-12-06 VITALS — BP 142/92 | HR 71 | Temp 98.1°F | Ht 70.0 in | Wt 250.0 lb

## 2019-12-06 DIAGNOSIS — I10 Essential (primary) hypertension: Secondary | ICD-10-CM

## 2019-12-06 DIAGNOSIS — F321 Major depressive disorder, single episode, moderate: Secondary | ICD-10-CM

## 2019-12-06 DIAGNOSIS — M791 Myalgia, unspecified site: Secondary | ICD-10-CM | POA: Diagnosis not present

## 2019-12-06 DIAGNOSIS — G894 Chronic pain syndrome: Secondary | ICD-10-CM | POA: Diagnosis not present

## 2019-12-06 DIAGNOSIS — M5416 Radiculopathy, lumbar region: Secondary | ICD-10-CM | POA: Diagnosis not present

## 2019-12-06 DIAGNOSIS — M79673 Pain in unspecified foot: Secondary | ICD-10-CM | POA: Diagnosis not present

## 2019-12-06 NOTE — Patient Instructions (Signed)
Cut the Lexapro in half.  Take half once a day and check your blood pressure about once a day  Call me in 2 weeks to report blood pressure, headache symptoms and depression symptoms.  Call me sooner if you are worsening.

## 2019-12-06 NOTE — Progress Notes (Signed)
Date:  12/06/2019   Name:  Chad Choi   DOB:  [birth year]   MRN:  [id number]   Chief Complaint: Hypertension  Hypertension This is a chronic problem. The problem has been gradually worsening since onset. Associated symptoms include headaches. Pertinent negatives include no chest pain or shortness of breath. Associated agents: recently started lexapro. Past treatments include calcium channel blockers. The current treatment provides significant improvement. There are no compliance problems.     Lab Results  Component Value Date   CREATININE 0.94 11/25/2019   BUN 14 11/25/2019   NA 142 11/25/2019   K 4.5 11/25/2019   CL 102 11/25/2019   CO2 23 11/25/2019   Lab Results  Component Value Date   CHOL 154 11/25/2019   HDL 43 11/25/2019   LDLCALC 85 11/25/2019   TRIG 152 (H) 11/25/2019   CHOLHDL 3.6 11/25/2019   Lab Results  Component Value Date   TSH 0.683 11/25/2019   No results found for: HGBA1C   Review of Systems  Constitutional: Negative for chills, fatigue and fever.  Respiratory: Negative for cough, shortness of breath and wheezing.   Cardiovascular: Negative for chest pain and leg swelling.  Neurological: Positive for headaches. Negative for dizziness, tremors, weakness and numbness.  Psychiatric/Behavioral: Positive for dysphoric mood. Negative for sleep disturbance.    Patient Active Problem List   Diagnosis Date Noted  . Current moderate episode of major depressive disorder without prior episode (HCC) 11/25/2019  . Migraine without aura and without status migrainosus, not intractable 08/08/2019  . Aortic stenosis 05/25/2019  . Vitamin D deficiency 03/09/2019  . History of atrial fibrillation 03/09/2019  . Severe obesity (HCC) 09/07/2018  . History of arthroplasty of right ankle 07/05/2018  . Degeneration of intervertebral disc of cervical region 04/20/2018  . Hyperlipidemia 04/20/2018  . Essential hypertension 04/20/2018  . Hx of adenomatous colonic  polyps 04/20/2018  . OSA on CPAP 04/20/2018  . Primary osteoarthritis of both hands 06/06/2016  . Other age-related incipient cataract, bilateral 05/13/2016    Allergies  Allergen Reactions  . Naproxen Hives  . Sulfa Antibiotics Hives and Other (See Comments)    Past Surgical History:  Procedure Laterality Date  . TOTAL ANKLE REPLACEMENT Right 03/2018    Social History   Tobacco Use  . Smoking status: Former Smoker    Packs/day: 1.50    Years: 16.00    Pack years: 24.00    Types: Cigarettes    Quit date: 03/28/1987    Years since quitting: 32.7  . Smokeless tobacco: Former Neurosurgeon    Quit date: 1989  Substance Use Topics  . Alcohol use: Never  . Drug use: Never     Medication list has been reviewed and updated.  Current Meds  Medication Sig  . Acetaminophen 325 MG CAPS Take by mouth.  Marland Kitchen atorvastatin (LIPITOR) 20 MG tablet Take 1 tablet (20 mg total) by mouth daily at 6 PM.  . Cholecalciferol (D3-50) 1.25 MG (50000 UT) capsule Take 1 capsule (50,000 Units total) by mouth once a week.  . diltiazem (CARDIZEM CD) 120 MG 24 hr capsule TAKE 1 CAPSULE BY MOUTH EVERY DAY  . escitalopram (LEXAPRO) 10 MG tablet Take 1 tablet (10 mg total) by mouth daily.  Marland Kitchen gabapentin (NEURONTIN) 100 MG capsule Take 100 mg by mouth daily.   . hydrocortisone 2.5 % cream Apply topically.  . NON FORMULARY CPAP nightly  . omeprazole (PRILOSEC) 20 MG capsule Take 20 mg by mouth daily.  PHQ 2/9 Scores 12/06/2019 11/25/2019 08/08/2019 05/25/2019  PHQ - 2 Score 0 4 0 0  PHQ- 9 Score 0 12 4 0    BP Readings from Last 3 Encounters:  12/06/19 (!) 142/92  11/25/19 132/85  08/08/19 138/84    Physical Exam Vitals and nursing note reviewed.  Constitutional:      General: He is not in acute distress.    Appearance: Normal appearance. He is well-developed.  HENT:     Head: Normocephalic and atraumatic.  Cardiovascular:     Rate and Rhythm: Normal rate and regular rhythm.     Pulses: Normal  pulses.  Pulmonary:     Effort: Pulmonary effort is normal. No respiratory distress.     Breath sounds: Normal breath sounds.  Musculoskeletal:     Cervical back: Normal range of motion.     Right lower leg: No edema.     Left lower leg: No edema.  Lymphadenopathy:     Cervical: No cervical adenopathy.  Skin:    General: Skin is warm and dry.     Findings: No rash.  Neurological:     Mental Status: He is alert and oriented to person, place, and time.  Psychiatric:        Behavior: Behavior normal.        Thought Content: Thought content normal.     Wt Readings from Last 3 Encounters:  12/06/19 250 lb (113.4 kg)  11/25/19 247 lb (112 kg)  08/08/19 246 lb (111.6 kg)    BP (!) 142/92 (BP Location: Left Arm, Patient Position: Sitting, Cuff Size: Large) Comment: pt brought cuff - automatic  Pulse 71   Temp 98.1 F (36.7 C) (Oral)   Ht 5\' 10"  (1.778 m)   Wt 250 lb (113.4 kg)   SpO2 96%   BMI 35.87 kg/m   Assessment and Plan: 1. Essential hypertension Suspect BP is elevated due to recent SSRI prescription Will cut medication to 5 mg per day and monitor BP for 2 weeks Call in 2 weeks to report  2. 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 Any errors are unintentional.  Halina Maidens, MD [facility]  12/06/2019

## 2019-12-07 ENCOUNTER — Other Ambulatory Visit: Payer: Self-pay | Admitting: Internal Medicine

## 2019-12-07 DIAGNOSIS — E782 Mixed hyperlipidemia: Secondary | ICD-10-CM

## 2019-12-17 ENCOUNTER — Other Ambulatory Visit: Payer: Self-pay | Admitting: Internal Medicine

## 2019-12-17 DIAGNOSIS — F321 Major depressive disorder, single episode, moderate: Secondary | ICD-10-CM

## 2019-12-18 ENCOUNTER — Other Ambulatory Visit: Payer: Self-pay | Admitting: Internal Medicine

## 2019-12-22 DIAGNOSIS — L600 Ingrowing nail: Secondary | ICD-10-CM | POA: Diagnosis not present

## 2020-01-03 ENCOUNTER — Encounter: Payer: Self-pay | Admitting: Internal Medicine

## 2020-01-04 ENCOUNTER — Encounter: Payer: Self-pay | Admitting: Internal Medicine

## 2020-01-04 ENCOUNTER — Other Ambulatory Visit: Payer: Self-pay | Admitting: Internal Medicine

## 2020-01-04 DIAGNOSIS — R079 Chest pain, unspecified: Secondary | ICD-10-CM

## 2020-01-10 ENCOUNTER — Other Ambulatory Visit: Payer: Self-pay | Admitting: Internal Medicine

## 2020-01-10 ENCOUNTER — Encounter: Payer: Self-pay | Admitting: Internal Medicine

## 2020-01-10 DIAGNOSIS — F321 Major depressive disorder, single episode, moderate: Secondary | ICD-10-CM

## 2020-01-16 ENCOUNTER — Encounter: Payer: Self-pay | Admitting: Internal Medicine

## 2020-01-16 DIAGNOSIS — I10 Essential (primary) hypertension: Secondary | ICD-10-CM | POA: Diagnosis not present

## 2020-01-16 DIAGNOSIS — R079 Chest pain, unspecified: Secondary | ICD-10-CM | POA: Diagnosis not present

## 2020-01-16 DIAGNOSIS — R0600 Dyspnea, unspecified: Secondary | ICD-10-CM | POA: Diagnosis not present

## 2020-01-16 DIAGNOSIS — I25118 Atherosclerotic heart disease of native coronary artery with other forms of angina pectoris: Secondary | ICD-10-CM | POA: Diagnosis not present

## 2020-01-29 DIAGNOSIS — I25118 Atherosclerotic heart disease of native coronary artery with other forms of angina pectoris: Secondary | ICD-10-CM | POA: Insufficient documentation

## 2020-01-30 DIAGNOSIS — R0600 Dyspnea, unspecified: Secondary | ICD-10-CM | POA: Diagnosis not present

## 2020-02-01 DIAGNOSIS — Z86010 Personal history of colonic polyps: Secondary | ICD-10-CM | POA: Diagnosis not present

## 2020-02-01 DIAGNOSIS — Z20822 Contact with and (suspected) exposure to covid-19: Secondary | ICD-10-CM | POA: Diagnosis not present

## 2020-02-03 DIAGNOSIS — D122 Benign neoplasm of ascending colon: Secondary | ICD-10-CM | POA: Diagnosis not present

## 2020-02-03 DIAGNOSIS — Z1211 Encounter for screening for malignant neoplasm of colon: Secondary | ICD-10-CM | POA: Diagnosis not present

## 2020-02-03 DIAGNOSIS — K5730 Diverticulosis of large intestine without perforation or abscess without bleeding: Secondary | ICD-10-CM | POA: Diagnosis not present

## 2020-02-03 DIAGNOSIS — D125 Benign neoplasm of sigmoid colon: Secondary | ICD-10-CM | POA: Diagnosis not present

## 2020-02-03 DIAGNOSIS — Z86010 Personal history of colonic polyps: Secondary | ICD-10-CM | POA: Diagnosis not present

## 2020-02-03 LAB — HM COLONOSCOPY

## 2020-03-01 DIAGNOSIS — M791 Myalgia, unspecified site: Secondary | ICD-10-CM | POA: Diagnosis not present

## 2020-03-01 DIAGNOSIS — M79673 Pain in unspecified foot: Secondary | ICD-10-CM | POA: Diagnosis not present

## 2020-03-01 DIAGNOSIS — G894 Chronic pain syndrome: Secondary | ICD-10-CM | POA: Diagnosis not present

## 2020-03-01 DIAGNOSIS — Z79891 Long term (current) use of opiate analgesic: Secondary | ICD-10-CM | POA: Diagnosis not present

## 2020-03-01 DIAGNOSIS — M5416 Radiculopathy, lumbar region: Secondary | ICD-10-CM | POA: Diagnosis not present

## 2020-03-14 DIAGNOSIS — W010XXA Fall on same level from slipping, tripping and stumbling without subsequent striking against object, initial encounter: Secondary | ICD-10-CM | POA: Diagnosis not present

## 2020-03-14 DIAGNOSIS — M25571 Pain in right ankle and joints of right foot: Secondary | ICD-10-CM | POA: Diagnosis not present

## 2020-03-14 DIAGNOSIS — M79661 Pain in right lower leg: Secondary | ICD-10-CM | POA: Diagnosis not present

## 2020-03-14 DIAGNOSIS — Y990 Civilian activity done for income or pay: Secondary | ICD-10-CM | POA: Diagnosis not present

## 2020-03-14 DIAGNOSIS — Z96661 Presence of right artificial ankle joint: Secondary | ICD-10-CM | POA: Diagnosis not present

## 2020-03-15 ENCOUNTER — Encounter: Payer: Self-pay | Admitting: Internal Medicine

## 2020-03-21 DIAGNOSIS — H6121 Impacted cerumen, right ear: Secondary | ICD-10-CM | POA: Diagnosis not present

## 2020-03-23 DIAGNOSIS — Z1389 Encounter for screening for other disorder: Secondary | ICD-10-CM | POA: Diagnosis not present

## 2020-04-17 DIAGNOSIS — Z96661 Presence of right artificial ankle joint: Secondary | ICD-10-CM | POA: Diagnosis not present

## 2020-04-23 DIAGNOSIS — R0789 Other chest pain: Secondary | ICD-10-CM | POA: Diagnosis not present

## 2020-04-23 DIAGNOSIS — R011 Cardiac murmur, unspecified: Secondary | ICD-10-CM | POA: Diagnosis not present

## 2020-04-23 DIAGNOSIS — I10 Essential (primary) hypertension: Secondary | ICD-10-CM | POA: Diagnosis not present

## 2020-04-23 DIAGNOSIS — I712 Thoracic aortic aneurysm, without rupture: Secondary | ICD-10-CM | POA: Diagnosis not present

## 2020-04-30 ENCOUNTER — Other Ambulatory Visit: Payer: Self-pay

## 2020-04-30 ENCOUNTER — Ambulatory Visit: Payer: BC Managed Care – PPO | Admitting: Internal Medicine

## 2020-04-30 VITALS — BP 142/84 | HR 72 | Temp 98.5°F | Ht 70.0 in | Wt 247.0 lb

## 2020-04-30 DIAGNOSIS — W57XXXA Bitten or stung by nonvenomous insect and other nonvenomous arthropods, initial encounter: Secondary | ICD-10-CM | POA: Diagnosis not present

## 2020-04-30 DIAGNOSIS — S80862A Insect bite (nonvenomous), left lower leg, initial encounter: Secondary | ICD-10-CM

## 2020-04-30 DIAGNOSIS — L03116 Cellulitis of left lower limb: Secondary | ICD-10-CM | POA: Diagnosis not present

## 2020-04-30 MED ORDER — CEPHALEXIN 500 MG PO CAPS: 500.0000 mg | ORAL_CAPSULE | Freq: Four times a day (QID) | ORAL | 0 refills | Status: AC

## 2020-04-30 MED ORDER — PREDNISONE 10 MG PO TABS: 10.0000 mg | ORAL_TABLET | ORAL | 0 refills | Status: AC

## 2020-04-30 NOTE — Progress Notes (Signed)
c   Date:  04/30/2020   Name:  Chad Choi   DOB:  02-04-1956   MRN:  [id number]   Chief Complaint: Insect Bite (X3 days,left lower leg above ankle, yellow jacket???, painful, red, sore, itching )  Pt was stung by an unknown insect on his left lower leg while cutting the grass on 04/27/20.  He was able to continue his work.  Over the weekend the leg become more swollen and tender along with itching.  He has not noticed any bleeding or drainage.  Lab Results  Component Value Date   CREATININE 0.94 11/25/2019   BUN 14 11/25/2019   NA 142 11/25/2019   K 4.5 11/25/2019   CL 102 11/25/2019   CO2 23 11/25/2019   Lab Results  Component Value Date   CHOL 154 11/25/2019   HDL 43 11/25/2019   LDLCALC 85 11/25/2019   TRIG 152 (H) 11/25/2019   CHOLHDL 3.6 11/25/2019   Lab Results  Component Value Date   TSH 0.683 11/25/2019   No results found for: HGBA1C Lab Results  Component Value Date   WBC 5.1 11/25/2019   HGB 15.8 11/25/2019   HCT 47.2 11/25/2019   MCV 84 11/25/2019   PLT 233 11/25/2019   Lab Results  Component Value Date   ALT 24 11/25/2019   AST 22 11/25/2019   ALKPHOS 77 11/25/2019   BILITOT 0.9 11/25/2019     Review of Systems  Constitutional: Negative for chills, fatigue and fever.  Respiratory: Negative for shortness of breath.   Cardiovascular: Positive for leg swelling. Negative for chest pain.  Gastrointestinal: Negative for abdominal pain.  Skin: Positive for color change and wound.    Patient Active Problem List   Diagnosis Date Noted  . Coronary artery disease of native artery of native heart with stable angina pectoris (HCC) 01/29/2020  . Current moderate episode of major depressive disorder without prior episode (HCC) 11/25/2019  . Migraine without aura and without status migrainosus, not intractable 08/08/2019  . Aortic stenosis 05/25/2019  . Vitamin D deficiency 03/09/2019  . History of atrial fibrillation 03/09/2019  . Severe obesity (HCC)  09/07/2018  . History of arthroplasty of right ankle 07/05/2018  . Degeneration of intervertebral disc of cervical region 04/20/2018  . Hyperlipidemia 04/20/2018  . Essential hypertension 04/20/2018  . Hx of adenomatous colonic polyps 04/20/2018  . OSA on CPAP 04/20/2018  . Primary osteoarthritis of both hands 06/06/2016  . Other age-related incipient cataract, bilateral 05/13/2016    Allergies  Allergen Reactions  . Naproxen Hives  . Sulfa Antibiotics Hives and Other (See Comments)    Past Surgical History:  Procedure Laterality Date  . TOTAL ANKLE REPLACEMENT Right 03/2018    Social History   Tobacco Use  . Smoking status: Former Smoker    Packs/day: 1.50    Years: 16.00    Pack years: 24.00    Types: Cigarettes    Quit date: 03/28/1987    Years since quitting: 33.1  . Smokeless tobacco: Former Neurosurgeon    Quit date: 1989  Substance Use Topics  . Alcohol use: Never  . Drug use: Never     Medication list has been reviewed and updated.  Current Meds  Medication Sig  . Acetaminophen 325 MG CAPS Take by mouth.  Marland Kitchen atorvastatin (LIPITOR) 20 MG tablet TAKE 1 TABLET (20 MG TOTAL) BY MOUTH DAILY AT 6 PM. (Patient taking differently: Take 40 mg by mouth daily at 6 PM. )  . ibuprofen (ADVIL)  600 MG tablet Take by mouth.  Marland Kitchen lisinopril (ZESTRIL) 40 MG tablet Use as directed 40 mg in the mouth or throat daily.  . NON FORMULARY CPAP nightly  . oxyCODONE (OXY IR/ROXICODONE) 5 MG immediate release tablet Take 5 mg by mouth every 6 (six) hours.    PHQ 2/9 Scores 04/30/2020 12/06/2019 11/25/2019 08/08/2019  PHQ - 2 Score 0 0 4 0  PHQ- 9 Score 2 0 12 4    GAD 7 : Generalized Anxiety Score 04/30/2020 12/06/2019 03/09/2019  Nervous, Anxious, on Edge 0 0 3  Control/stop worrying 0 0 3  Worry too much - different things 0 0 3  Trouble relaxing 0 0 3  Restless 0 0 3  Easily annoyed or irritable 0 0 0  Afraid - awful might happen 0 0 3  Total GAD 7 Score 0 0 18  Anxiety Difficulty Not  difficult at all Not difficult at all Extremely difficult    BP Readings from Last 3 Encounters:  04/30/20 (!) 142/84  12/06/19 (!) 142/92  11/25/19 132/85    Physical Exam Cardiovascular:     Rate and Rhythm: Normal rate and regular rhythm.     Heart sounds: No murmur heard.   Pulmonary:     Effort: Pulmonary effort is normal.     Breath sounds: Normal breath sounds.  Musculoskeletal:     Cervical back: Normal range of motion.  Lymphadenopathy:     Cervical: No cervical adenopathy.  Skin:         Comments: Warm red indurated lower leg No drainage noted     Wt Readings from Last 3 Encounters:  04/30/20 247 lb (112 kg)  12/06/19 250 lb (113.4 kg)  11/25/19 247 lb (112 kg)    BP (!) 142/84   Pulse 72   Temp 98.5 F (36.9 C) (Oral)   Ht 5\' 10"  (1.778 m)   Wt 247 lb (112 kg)   SpO2 97%   BMI 35.44 kg/m   Assessment and Plan: 1. Cellulitis of left lower extremity Elevate leg as much as possible to reduce swelling - cephALEXin (KEFLEX) 500 MG capsule; Take 1 capsule (500 mg total) by mouth 4 (four) times daily for 10 days.  Dispense: 40 capsule; Refill: 0  2. Insect bite of left lower leg, initial encounter - predniSONE (DELTASONE) 10 MG tablet; Take 1 tablet (10 mg total) by mouth as directed for 6 days. Take 6,5,4,3,2,1 then stop  Dispense: 21 tablet; Refill: 0   Partially dictated using . Any errors are unintentional.  Animal nutritionist, MD [facility]  04/30/2020

## 2020-04-30 NOTE — Patient Instructions (Signed)
Elevate as much as possible.  Can use ice if needed.

## 2020-05-07 DIAGNOSIS — Z96661 Presence of right artificial ankle joint: Secondary | ICD-10-CM | POA: Diagnosis not present

## 2020-05-08 ENCOUNTER — Encounter: Payer: Self-pay | Admitting: Internal Medicine

## 2020-05-21 IMAGING — CR CHEST - 2 VIEW
2 series · 2 of 2 positions shown · non-contrast
Comparison: None.

CLINICAL DATA: Pneumonia follow-up

EXAM:
CHEST - 2 VIEW

[chest pa]
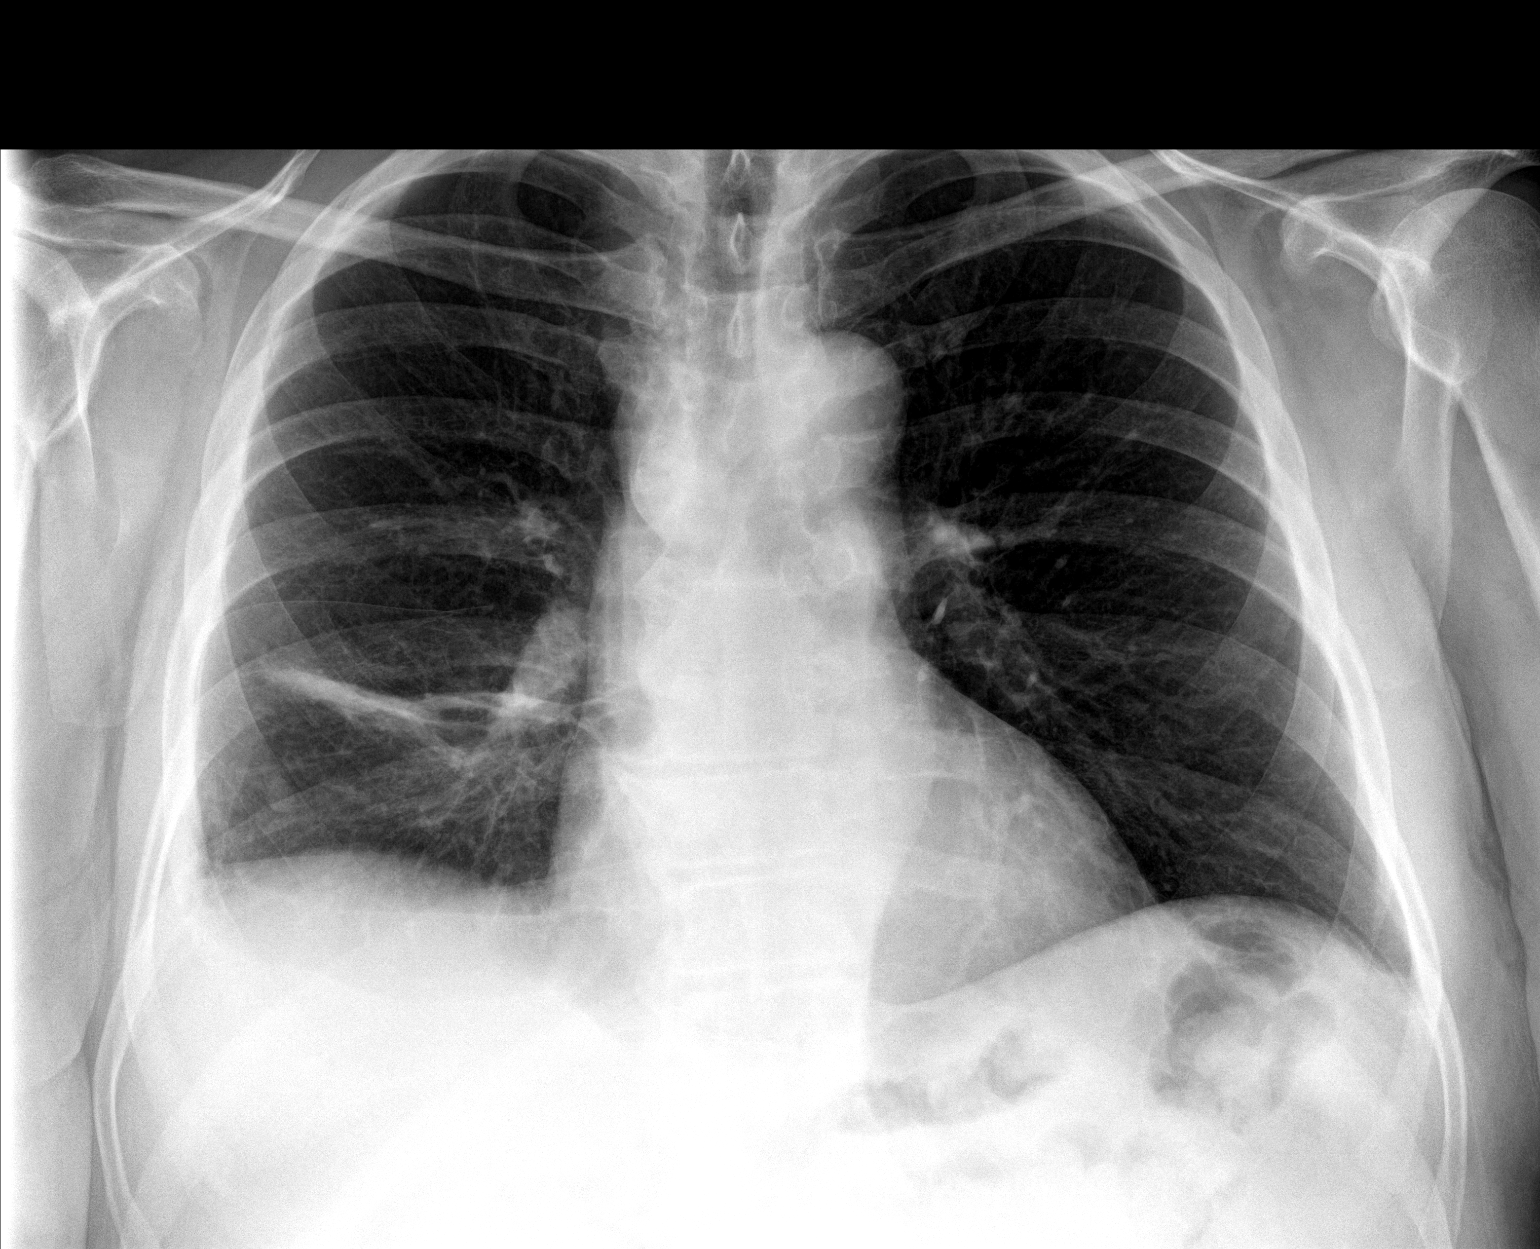

[chest lat]
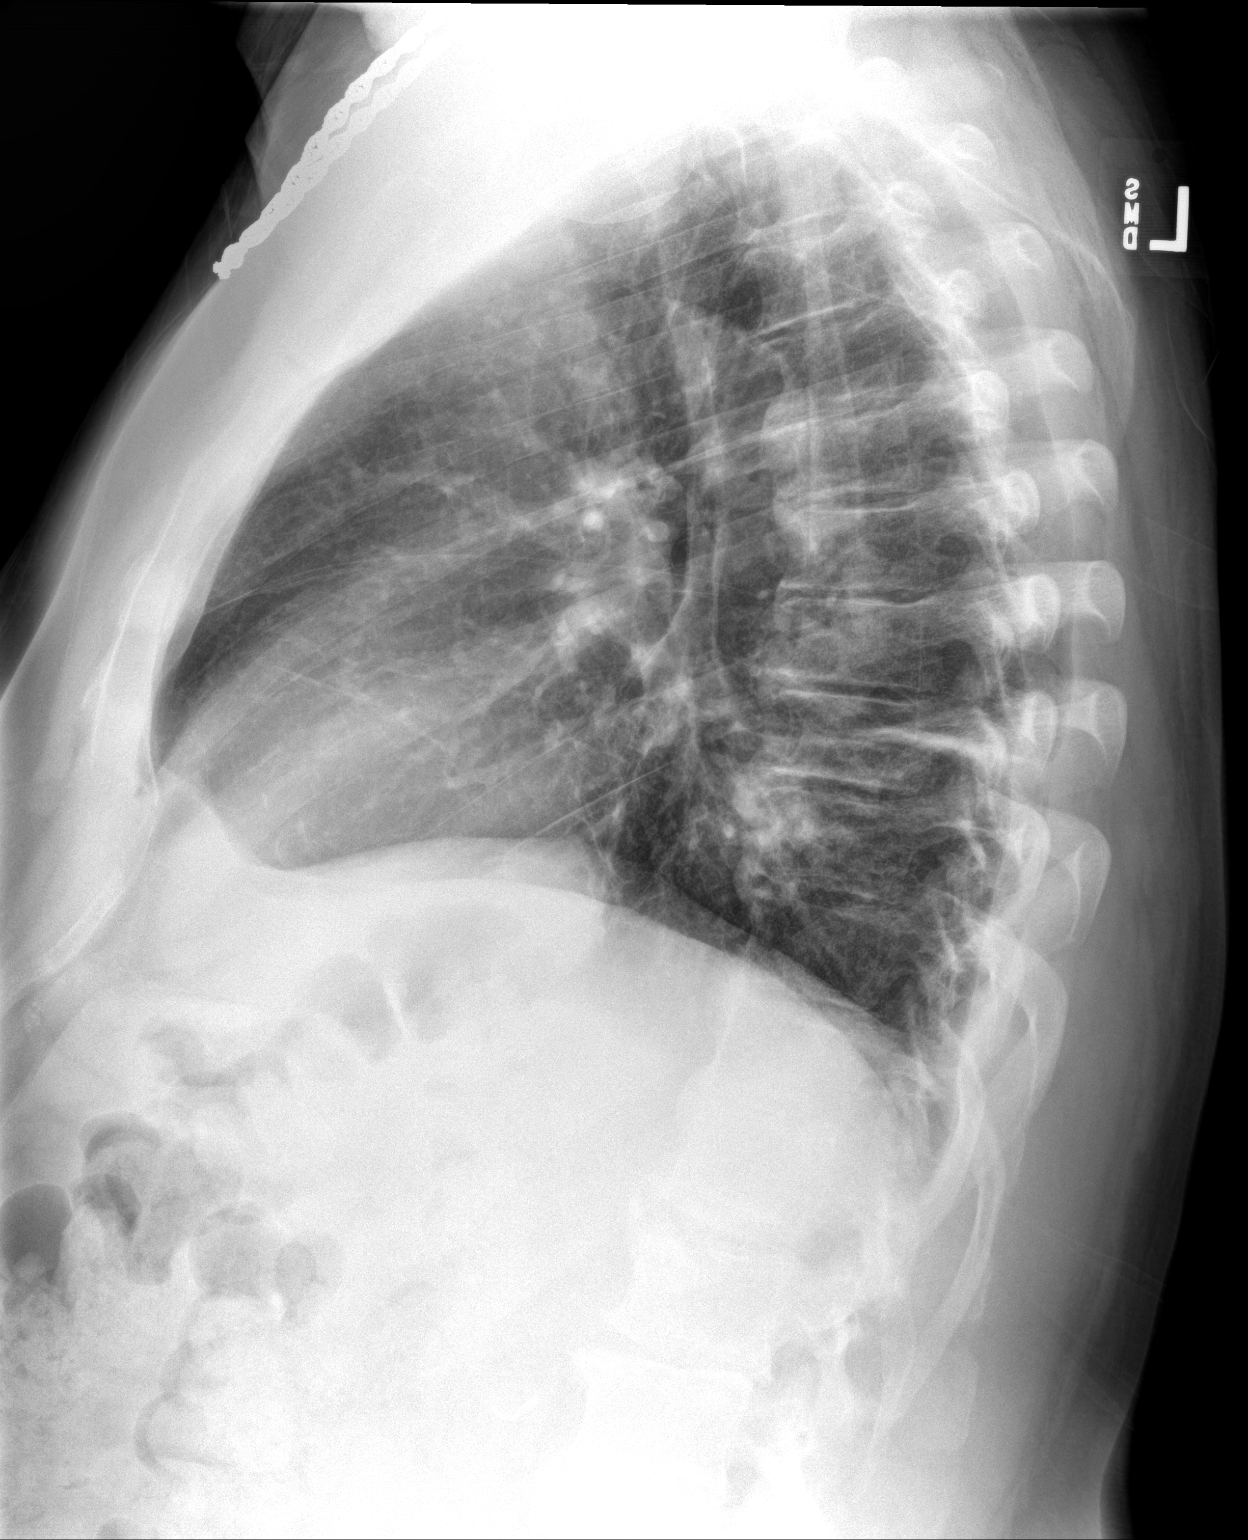

[2 of 2 positions shown; findings below may reference images not displayed]

FINDINGS: There is a linear airspace opacity in the right lower lobe. There is
a small right-sided pleural effusion. There is a right basilar
airspace opacity. No pneumothorax. The left lung field is
essentially clear. The cardiac silhouette is borderline enlarged.
There is no acute osseous abnormality.
IMPRESSION: 1. No prior x-ray available for comparison.
2. Linear opacity in the right lower lobe as well as a right lung
base opacity are suspicious for pneumonia versus atelectasis.
Follow-up to radiologic resolution is recommended.
3. Trace to small right-sided pleural effusion.

## 2020-05-23 DIAGNOSIS — L600 Ingrowing nail: Secondary | ICD-10-CM | POA: Diagnosis not present

## 2020-05-24 DIAGNOSIS — M5416 Radiculopathy, lumbar region: Secondary | ICD-10-CM | POA: Diagnosis not present

## 2020-05-24 DIAGNOSIS — M79673 Pain in unspecified foot: Secondary | ICD-10-CM | POA: Diagnosis not present

## 2020-05-24 DIAGNOSIS — M791 Myalgia, unspecified site: Secondary | ICD-10-CM | POA: Diagnosis not present

## 2020-05-24 DIAGNOSIS — G894 Chronic pain syndrome: Secondary | ICD-10-CM | POA: Diagnosis not present

## 2020-05-31 DIAGNOSIS — K219 Gastro-esophageal reflux disease without esophagitis: Secondary | ICD-10-CM | POA: Diagnosis not present

## 2020-05-31 DIAGNOSIS — R490 Dysphonia: Secondary | ICD-10-CM | POA: Diagnosis not present

## 2020-06-18 IMAGING — CR CHEST - 2 VIEW
2 series · 2 of 2 positions shown · non-contrast
Comparison: Chest x-ray March 25, 2019

CLINICAL DATA: Right lower lobe dural pneumonia.

EXAM:
CHEST - 2 VIEW

[chest pa]
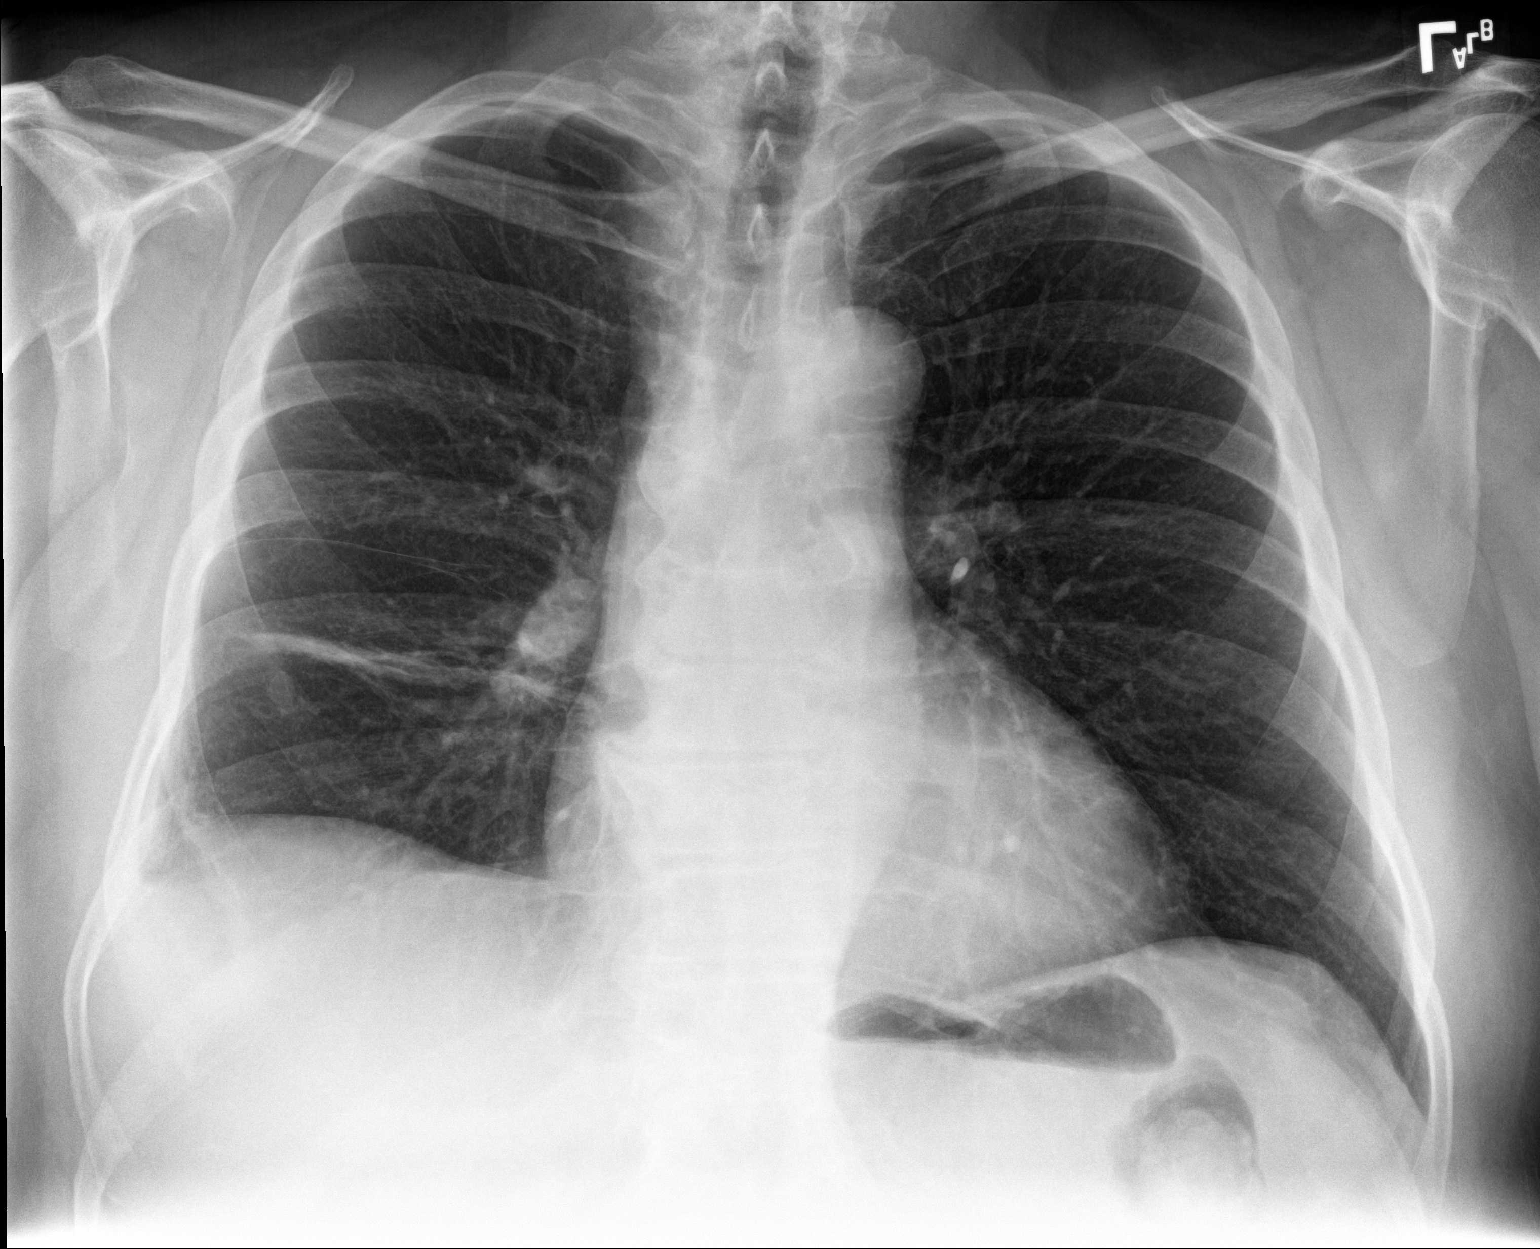

[chest lat]
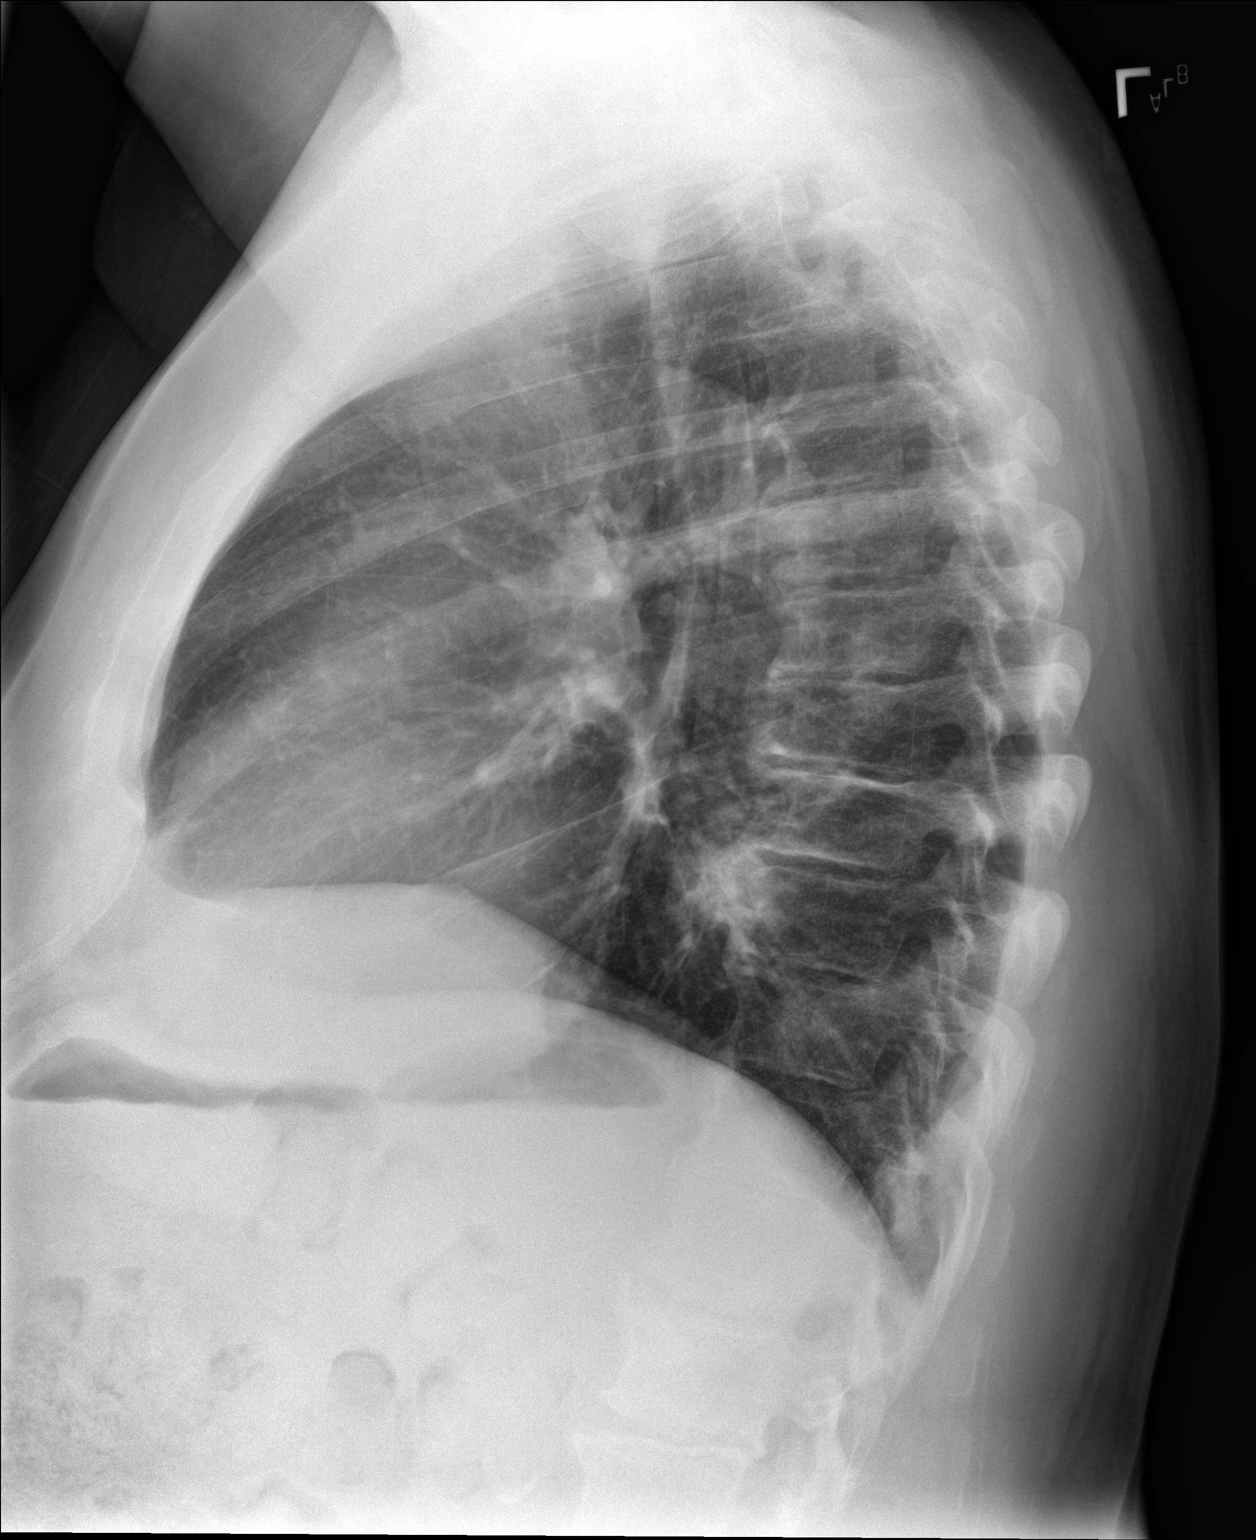

[2 of 2 positions shown; findings below may reference images not displayed]

FINDINGS: Cardiomediastinal silhouette is normal. Mediastinal contours appear
intact. Mild calcific atherosclerotic disease and tortuosity of the
aorta.

There is no evidence of pleural effusion or pneumothorax. Persistent
linear airspace opacity in the right lower lobe

Osseous structures are without acute abnormality. Soft tissues are
grossly normal.
IMPRESSION: Persistent linear airspace opacity in the right lower lobe.

## 2020-06-28 DIAGNOSIS — I10 Essential (primary) hypertension: Secondary | ICD-10-CM | POA: Diagnosis not present

## 2020-06-28 DIAGNOSIS — Z8639 Personal history of other endocrine, nutritional and metabolic disease: Secondary | ICD-10-CM | POA: Diagnosis not present

## 2020-06-28 DIAGNOSIS — G4733 Obstructive sleep apnea (adult) (pediatric): Secondary | ICD-10-CM | POA: Diagnosis not present

## 2020-06-28 DIAGNOSIS — G8929 Other chronic pain: Secondary | ICD-10-CM | POA: Diagnosis not present

## 2020-07-09 DIAGNOSIS — Z136 Encounter for screening for cardiovascular disorders: Secondary | ICD-10-CM | POA: Diagnosis not present

## 2020-07-09 DIAGNOSIS — Z01812 Encounter for preprocedural laboratory examination: Secondary | ICD-10-CM | POA: Diagnosis not present

## 2020-07-11 DIAGNOSIS — I712 Thoracic aortic aneurysm, without rupture: Secondary | ICD-10-CM | POA: Diagnosis not present

## 2020-07-16 ENCOUNTER — Encounter: Payer: Self-pay | Admitting: Internal Medicine

## 2020-07-16 DIAGNOSIS — R600 Localized edema: Secondary | ICD-10-CM | POA: Diagnosis not present

## 2020-07-16 DIAGNOSIS — M12571 Traumatic arthropathy, right ankle and foot: Secondary | ICD-10-CM | POA: Diagnosis not present

## 2020-07-16 DIAGNOSIS — Z96661 Presence of right artificial ankle joint: Secondary | ICD-10-CM | POA: Diagnosis not present

## 2020-07-18 DIAGNOSIS — L600 Ingrowing nail: Secondary | ICD-10-CM | POA: Diagnosis not present

## 2020-07-21 IMAGING — CR CHEST - 2 VIEW
2 series · 2 of 2 positions shown · non-contrast
Comparison: 04/22/2019

CLINICAL DATA: Follow-up pneumonia

EXAM:
CHEST - 2 VIEW

[chest lat]
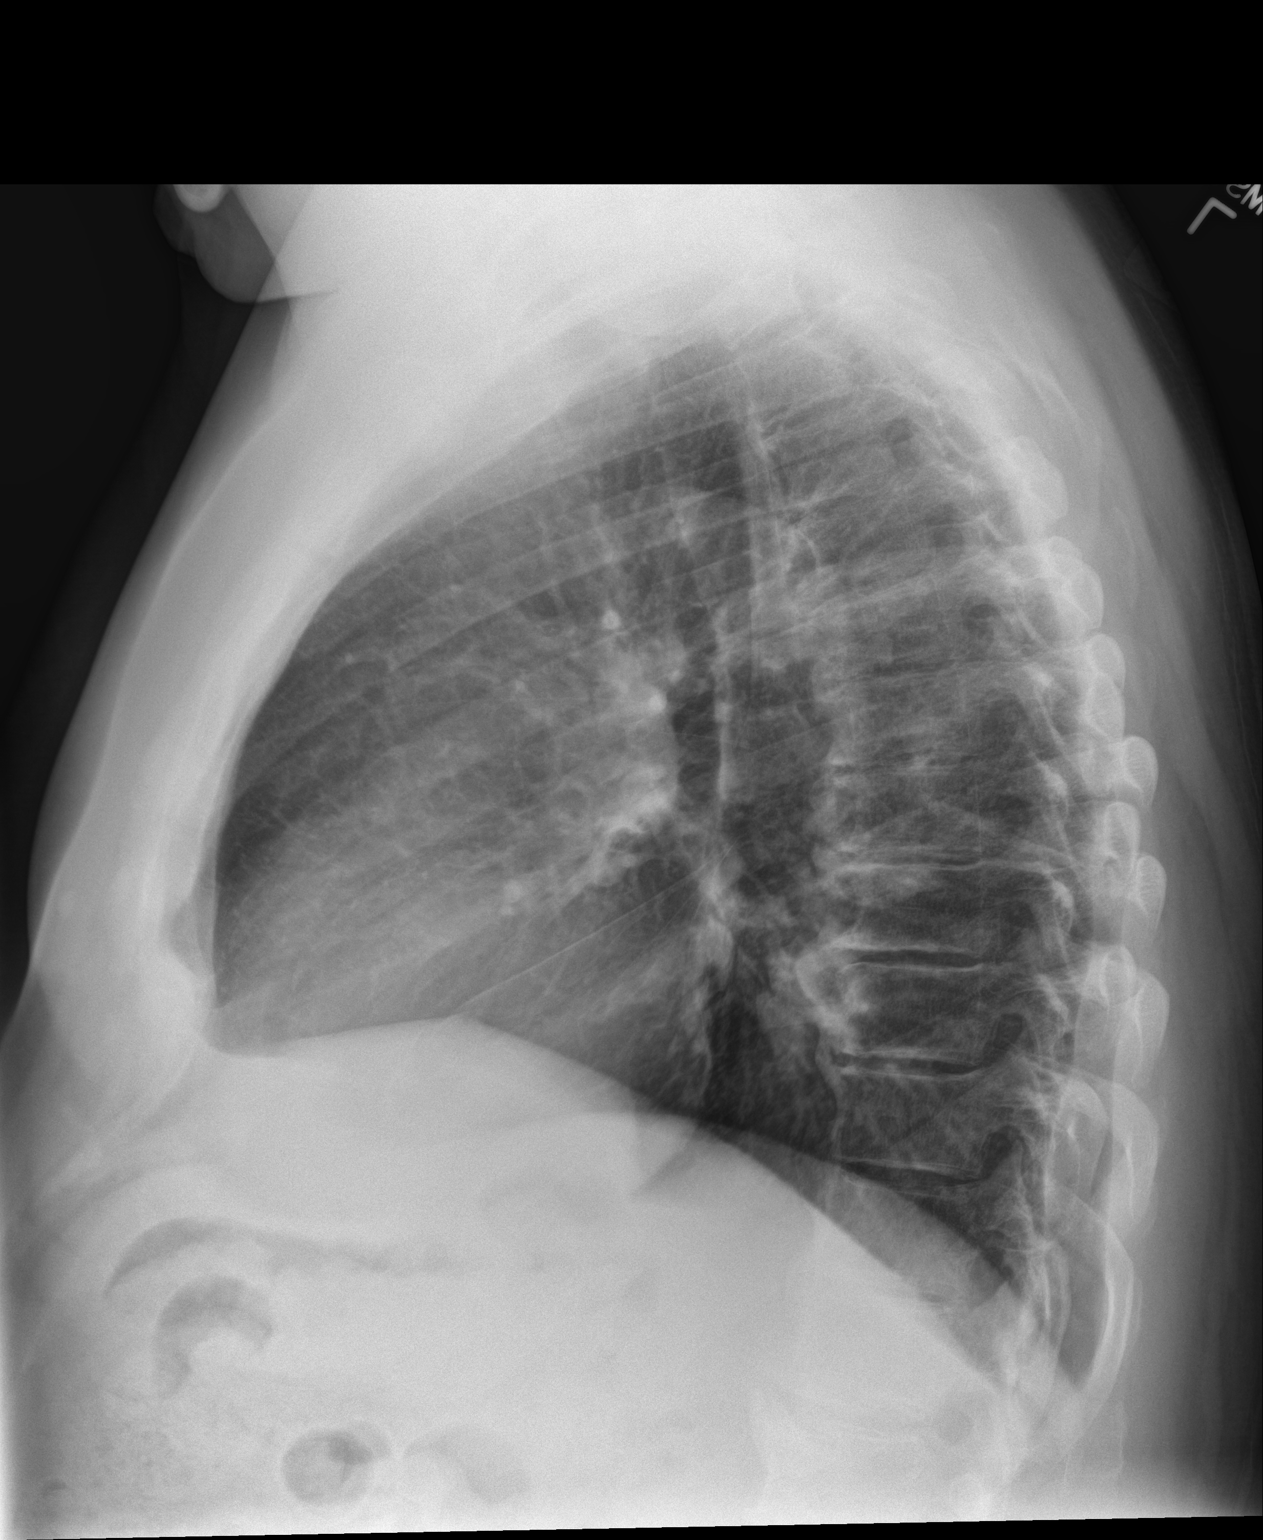

[chest pa]
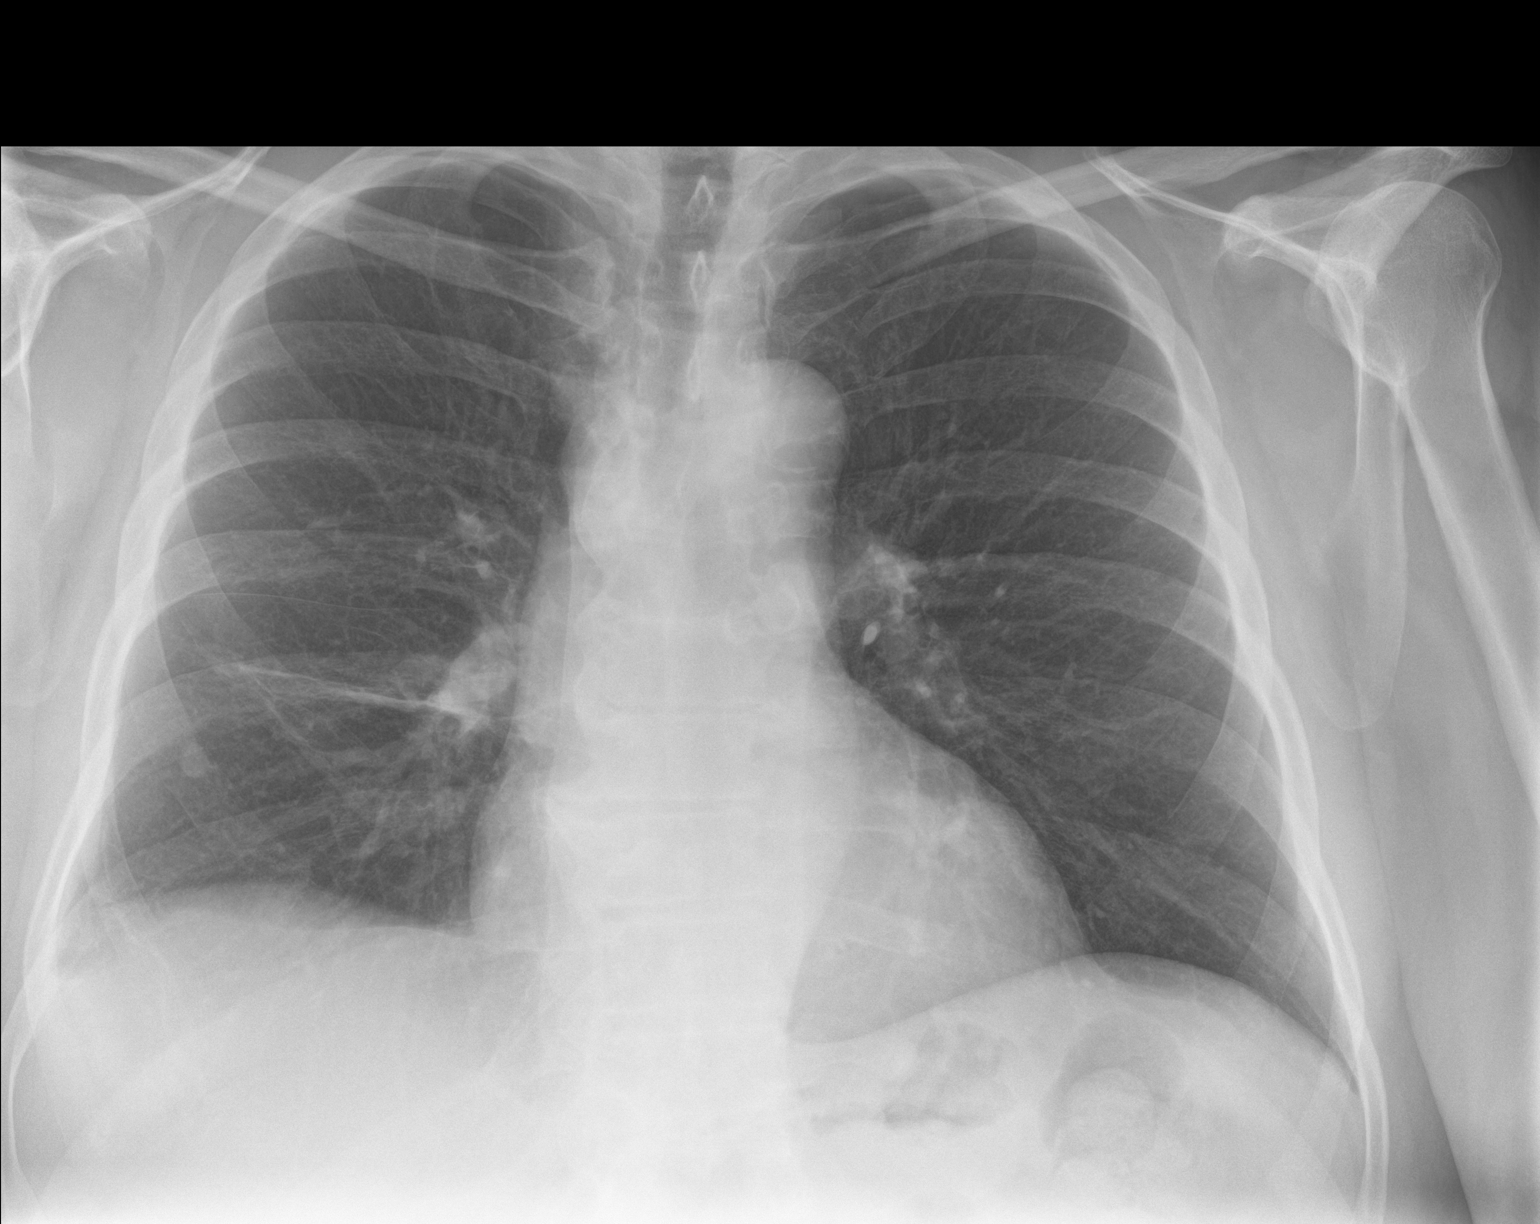

[2 of 2 positions shown; findings below may reference images not displayed]

FINDINGS: Platelike scarring/atelectasis in the right lower lung. Left lung is
clear. No pleural effusion or pneumothorax.

The heart is normal in size.

Degenerative changes of the visualized thoracolumbar spine.
IMPRESSION: Residual platelike scarring/atelectasis in the right lower lobe.

No evidence of acute cardiopulmonary disease.

## 2020-07-24 ENCOUNTER — Other Ambulatory Visit: Payer: Self-pay

## 2020-07-24 ENCOUNTER — Ambulatory Visit: Payer: Self-pay | Admitting: Internal Medicine

## 2020-07-24 ENCOUNTER — Encounter: Payer: Self-pay | Admitting: Internal Medicine

## 2020-07-24 ENCOUNTER — Ambulatory Visit (INDEPENDENT_AMBULATORY_CARE_PROVIDER_SITE_OTHER): Payer: BC Managed Care – PPO | Admitting: Internal Medicine

## 2020-07-24 VITALS — BP 160/98 | HR 78 | Temp 98.5°F | Ht 70.0 in | Wt 241.0 lb

## 2020-07-24 DIAGNOSIS — I10 Essential (primary) hypertension: Secondary | ICD-10-CM | POA: Diagnosis not present

## 2020-07-24 DIAGNOSIS — J0100 Acute maxillary sinusitis, unspecified: Secondary | ICD-10-CM | POA: Diagnosis not present

## 2020-07-24 MED ORDER — AMOXICILLIN-POT CLAVULANATE 875-125 MG PO TABS: 1.0000 | ORAL_TABLET | Freq: Two times a day (BID) | ORAL | 0 refills | Status: AC

## 2020-07-24 MED ORDER — AMLODIPINE BESYLATE 5 MG PO TABS: 5.0000 mg | ORAL_TABLET | Freq: Every day | ORAL | 1 refills | Status: DC

## 2020-07-24 NOTE — Telephone Encounter (Signed)
Pt sent MyChart message this AM. Calling now to report sore throat "Much better now but this  headache." States BP 157/103 this AM, at HS 158/104 and 158/99. Reports has been trending up. "Bad headache Saturday but could not check BP." Reports presently 7/10, has not taken anything for headache this AM. Did take pseudoephedrine  last night. Reports subjective fever yesterday, fatigued. States feels better today except for headache, throat "Little sore." No missed doses of Lisinopril. Pt fully vaccinated and had booster 07/10/20. Had flu shot last Monday.  Denies any CP, no SOB, no visual changes. States he had been on Amlodipine 5mg  along with the Lisinopril 40mg  in past. Pt questioning if he should start that again. States he can do virtual, is at work presently, asking if Dr. would consider adding Amlodipine, "But will do virtual if needed." Care advise given, go to ED for any worsening symptoms:  headache, unilateral weakness, speech difficulties,gait disturbances, dizziness, blurred vision occur. Advised to not rake any more pseudoephedrine. Pt verbalizes understanding.  Please advise: [phone]   Reason for Disposition . Systolic BP  >= 180 OR Diastolic >= 110  Answer Assessment - Initial Assessment Questions 1. BLOOD PRESSURE: "What is the blood pressure?" "Did you take at least two measurements 5 minutes apart?"     157/103 2. ONSET: "When did you take your blood pressure?"     This AM 3. HOW: "How did you obtain the blood pressure?" (e.g., visiting nurse, automatic home BP monitor)     Home monitor 4. HISTORY: "Do you have a history of high blood pressure?"     yes 5. MEDICATIONS: "Are you taking any medications for blood pressure?" "Have you missed any doses recently?"     No 6. OTHER SYMPTOMS: "Do you have any symptoms?" (e.g., headache, chest pain, blurred vision, difficulty breathing, weakness)     Headache, sore throat yesterday.  Protocols used: BLOOD PRESSURE -  HIGH-A-AH

## 2020-07-24 NOTE — Progress Notes (Signed)
Date:  07/24/2020   Name:  Chad Choi   DOB:  1956/07/09   MRN:  [id number]   Chief Complaint: Hypertension (sore throat wants to have his throat looked at , headache, fatigue, legs are weak)  Hypertension This is a chronic problem. The problem is uncontrolled (high for the past few days). Associated symptoms include headaches. Pertinent negatives include no chest pain or shortness of breath. Past treatments include ACE inhibitors. There are no compliance problems.   Sore Throat  This is a new problem. The current episode started yesterday. There has been no fever. The pain is mild. Associated symptoms include congestion, coughing, headaches and a plugged ear sensation. Pertinent negatives include no shortness of breath or trouble swallowing.    Lab Results  Component Value Date   CREATININE 0.94 11/25/2019   BUN 14 11/25/2019   NA 142 11/25/2019   K 4.5 11/25/2019   CL 102 11/25/2019   CO2 23 11/25/2019   Lab Results  Component Value Date   CHOL 154 11/25/2019   HDL 43 11/25/2019   LDLCALC 85 11/25/2019   TRIG 152 (H) 11/25/2019   CHOLHDL 3.6 11/25/2019   Lab Results  Component Value Date   TSH 0.683 11/25/2019   No results found for: HGBA1C Lab Results  Component Value Date   WBC 5.1 11/25/2019   HGB 15.8 11/25/2019   HCT 47.2 11/25/2019   MCV 84 11/25/2019   PLT 233 11/25/2019   Lab Results  Component Value Date   ALT 24 11/25/2019   AST 22 11/25/2019   ALKPHOS 77 11/25/2019   BILITOT 0.9 11/25/2019     Review of Systems  Constitutional: Negative for chills, fatigue and fever.  HENT: Positive for congestion, postnasal drip, sinus pressure and sore throat. Negative for trouble swallowing.   Respiratory: Positive for cough. Negative for chest tightness, shortness of breath and wheezing.   Cardiovascular: Negative for chest pain.  Neurological: Positive for headaches. Negative for dizziness and numbness.    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) 11/25/2019   Migraine without aura and without status migrainosus, not intractable 08/08/2019   Aortic stenosis 05/25/2019   Vitamin D deficiency 03/09/2019   History of atrial fibrillation 03/09/2019   Severe obesity (HCC) 09/07/2018   History of arthroplasty of right ankle 07/05/2018   Degeneration of intervertebral disc of cervical region 04/20/2018   Hyperlipidemia 04/20/2018   Essential hypertension 04/20/2018   Hx of adenomatous colonic polyps 04/20/2018   OSA on CPAP 04/20/2018   Primary osteoarthritis of both hands 06/06/2016   Other age-related incipient cataract, bilateral 05/13/2016    Allergies  Allergen Reactions   Naproxen Hives   Sulfa Antibiotics Hives and Other (See Comments)    Past Surgical History:  Procedure Laterality Date   TOTAL ANKLE REPLACEMENT Right 03/2018    Social History   Tobacco Use   Smoking status: Former Smoker    Packs/day: 1.50    Years: 16.00    Pack years: 24.00    Types: Cigarettes    Quit date: 03/28/1987    Years since quitting: 33.3   Smokeless tobacco: Former Neurosurgeon    Quit date: 1989  Substance Use Topics   Alcohol use: Never   Drug use: Never     Medication list has been  reviewed and updated.  Current Meds  Medication Sig   Acetaminophen 325 MG CAPS Take by mouth as needed.    atorvastatin (LIPITOR) 20 MG tablet TAKE 1 TABLET (20 MG TOTAL) BY MOUTH DAILY AT 6 PM. (Patient taking differently: Take 40 mg by mouth daily at 6 PM. )   Cholecalciferol 25 MCG (1000 UT) tablet Take 2 tablets by mouth daily.   cyanocobalamin 1000 MCG tablet Take 1 tablet by mouth daily.   ibuprofen (ADVIL) 600 MG tablet Take by mouth.   lisinopril (ZESTRIL) 40 MG tablet Use as directed 40 mg in the mouth or throat daily.    NON FORMULARY CPAP nightly   oxyCODONE (OXY IR/ROXICODONE) 5 MG immediate release tablet Take 5 mg by mouth every 6 (six) hours.    PHQ 2/9 Scores 07/24/2020 04/30/2020 12/06/2019 11/25/2019  PHQ - 2 Score 0 0 0 4  PHQ- 9 Score 0 2 0 12    GAD 7 : Generalized Anxiety Score 07/24/2020 04/30/2020 12/06/2019 03/09/2019  Nervous, Anxious, on Edge 0 0 0 3  Control/stop worrying 0 0 0 3  Worry too much - different things 0 0 0 3  Trouble relaxing 0 0 0 3  Restless 0 0 0 3  Easily annoyed or irritable 0 0 0 0  Afraid - awful might happen 0 0 0 3  Total GAD 7 Score 0 0 0 18  Anxiety Difficulty Not difficult at all Not difficult at all Not difficult at all Extremely difficult    BP Readings from Last 3 Encounters:  07/24/20 (!) 160/98  04/30/20 (!) 142/84  12/06/19 (!) 142/92    Physical Exam Constitutional:      General: He is not in acute distress.    Appearance: He is well-developed. He is not ill-appearing.  HENT:     Right Ear: Ear canal and external ear normal. Tympanic membrane is retracted. Tympanic membrane is not erythematous.     Left Ear: Ear canal and external ear normal. Tympanic membrane is erythematous and retracted.     Nose:     Right Sinus: Maxillary sinus tenderness and frontal sinus tenderness present.     Left Sinus: Maxillary sinus tenderness and frontal sinus tenderness present.     Mouth/Throat:     Mouth: No oral lesions.     Pharynx: Uvula midline. Posterior oropharyngeal erythema present. No oropharyngeal exudate.  Cardiovascular:     Rate and Rhythm: Normal rate and regular rhythm.     Heart sounds: Normal heart sounds.  Pulmonary:     Effort: Pulmonary effort is normal.     Breath sounds: Normal breath sounds. No wheezing, rhonchi or rales.  Lymphadenopathy:     Cervical: No cervical adenopathy.  Neurological:     Mental Status: He is alert and oriented to person, place, and time.     Wt Readings from Last 3 Encounters:  07/24/20 241 lb (109.3  kg)  04/30/20 247 lb (112 kg)  12/06/19 250 lb (113.4 kg)    BP (!) 160/98 (BP Location: Right Arm, Patient Position: Sitting)    Pulse 78    Temp 98.5 F (36.9 C) (Oral)    Ht 5\' 10"  (1.778 m)    Wt 241 lb (109.3 kg)    SpO2 96%    BMI 34.58 kg/m   Assessment and Plan: 1. Essential hypertension Uncontrolled - over the last few visits has been borderline Now much higher - possibly related to throat pain/sinus Recommend avoiding all sudafed products  Follow up in 6 weeks - amLODipine (NORVASC) 5 MG tablet; Take 1 tablet (5 mg total) by mouth daily.  Dispense: 90 tablet; Refill: 1  2. Acute non-recurrent maxillary sinusitis Continue plain Zyrtec - no sudafed - amoxicillin-clavulanate (AUGMENTIN) 875-125 MG tablet; Take 1 tablet by mouth 2 (two) times daily for 10 days.  Dispense: 20 tablet; Refill: 0   Partially dictated using Animal nutritionist. Any errors are unintentional.  Bari Edward, MD [facility]  07/24/2020

## 2020-07-24 NOTE — Telephone Encounter (Signed)
Pt has appt today 07/24/2020 @ 4:20PM  KP

## 2020-07-24 NOTE — Telephone Encounter (Signed)
Please Call pt to see if he can be here at 11:40.  KP

## 2020-07-24 NOTE — Patient Instructions (Signed)
Take PLAIN Claritin or Allegra or Zyrtec - this will help with post nasal drainage.   Do not use the "D" formulation. Do NOT take Sudafed or any medication that has sudafed in it.

## 2020-08-23 DIAGNOSIS — M791 Myalgia, unspecified site: Secondary | ICD-10-CM | POA: Diagnosis not present

## 2020-08-23 DIAGNOSIS — M5416 Radiculopathy, lumbar region: Secondary | ICD-10-CM | POA: Diagnosis not present

## 2020-08-23 DIAGNOSIS — M79673 Pain in unspecified foot: Secondary | ICD-10-CM | POA: Diagnosis not present

## 2020-08-23 DIAGNOSIS — G894 Chronic pain syndrome: Secondary | ICD-10-CM | POA: Diagnosis not present

## 2020-08-23 DIAGNOSIS — Z79891 Long term (current) use of opiate analgesic: Secondary | ICD-10-CM | POA: Diagnosis not present

## 2020-08-23 DIAGNOSIS — Z5181 Encounter for therapeutic drug level monitoring: Secondary | ICD-10-CM | POA: Diagnosis not present

## 2020-09-03 DIAGNOSIS — M7501 Adhesive capsulitis of right shoulder: Secondary | ICD-10-CM | POA: Diagnosis not present

## 2020-09-03 DIAGNOSIS — M75101 Unspecified rotator cuff tear or rupture of right shoulder, not specified as traumatic: Secondary | ICD-10-CM | POA: Diagnosis not present

## 2020-09-03 DIAGNOSIS — M19011 Primary osteoarthritis, right shoulder: Secondary | ICD-10-CM | POA: Diagnosis not present

## 2020-09-03 DIAGNOSIS — M25511 Pain in right shoulder: Secondary | ICD-10-CM | POA: Diagnosis not present

## 2020-09-04 ENCOUNTER — Ambulatory Visit: Payer: BC Managed Care – PPO | Admitting: Internal Medicine

## 2020-09-04 ENCOUNTER — Encounter: Payer: Self-pay | Admitting: Internal Medicine

## 2020-09-11 ENCOUNTER — Ambulatory Visit: Payer: BC Managed Care – PPO | Admitting: Internal Medicine

## 2020-09-11 DIAGNOSIS — M7501 Adhesive capsulitis of right shoulder: Secondary | ICD-10-CM | POA: Diagnosis not present

## 2020-09-25 DIAGNOSIS — N182 Chronic kidney disease, stage 2 (mild): Secondary | ICD-10-CM | POA: Insufficient documentation

## 2020-10-25 DIAGNOSIS — M75101 Unspecified rotator cuff tear or rupture of right shoulder, not specified as traumatic: Secondary | ICD-10-CM | POA: Diagnosis not present

## 2020-10-25 DIAGNOSIS — M19011 Primary osteoarthritis, right shoulder: Secondary | ICD-10-CM | POA: Diagnosis not present

## 2020-10-25 DIAGNOSIS — M25511 Pain in right shoulder: Secondary | ICD-10-CM | POA: Diagnosis not present

## 2020-10-25 DIAGNOSIS — M7501 Adhesive capsulitis of right shoulder: Secondary | ICD-10-CM | POA: Diagnosis not present

## 2020-10-29 ENCOUNTER — Encounter: Payer: Self-pay | Admitting: Internal Medicine

## 2020-10-29 ENCOUNTER — Other Ambulatory Visit: Payer: Self-pay | Admitting: Internal Medicine

## 2020-10-29 DIAGNOSIS — J019 Acute sinusitis, unspecified: Secondary | ICD-10-CM

## 2020-10-29 MED ORDER — AZITHROMYCIN 250 MG PO TABS: ORAL_TABLET | ORAL | 0 refills | Status: AC

## 2020-10-31 DIAGNOSIS — L600 Ingrowing nail: Secondary | ICD-10-CM | POA: Diagnosis not present

## 2020-11-07 DIAGNOSIS — M25511 Pain in right shoulder: Secondary | ICD-10-CM | POA: Diagnosis not present

## 2020-11-07 DIAGNOSIS — G8929 Other chronic pain: Secondary | ICD-10-CM | POA: Diagnosis not present

## 2020-11-08 DIAGNOSIS — M65871 Other synovitis and tenosynovitis, right ankle and foot: Secondary | ICD-10-CM | POA: Diagnosis not present

## 2020-11-08 DIAGNOSIS — M12571 Traumatic arthropathy, right ankle and foot: Secondary | ICD-10-CM | POA: Diagnosis not present

## 2020-11-08 DIAGNOSIS — Z96661 Presence of right artificial ankle joint: Secondary | ICD-10-CM | POA: Diagnosis not present

## 2020-11-20 DIAGNOSIS — M5416 Radiculopathy, lumbar region: Secondary | ICD-10-CM | POA: Diagnosis not present

## 2020-11-20 DIAGNOSIS — G894 Chronic pain syndrome: Secondary | ICD-10-CM | POA: Diagnosis not present

## 2020-11-20 DIAGNOSIS — M79673 Pain in unspecified foot: Secondary | ICD-10-CM | POA: Diagnosis not present

## 2020-11-20 DIAGNOSIS — M791 Myalgia, unspecified site: Secondary | ICD-10-CM | POA: Diagnosis not present

## 2020-11-22 DIAGNOSIS — M19011 Primary osteoarthritis, right shoulder: Secondary | ICD-10-CM | POA: Diagnosis not present

## 2020-11-22 DIAGNOSIS — M7501 Adhesive capsulitis of right shoulder: Secondary | ICD-10-CM | POA: Diagnosis not present

## 2020-11-28 ENCOUNTER — Ambulatory Visit (INDEPENDENT_AMBULATORY_CARE_PROVIDER_SITE_OTHER): Payer: BC Managed Care – PPO | Admitting: Internal Medicine

## 2020-11-28 ENCOUNTER — Other Ambulatory Visit: Payer: Self-pay

## 2020-11-28 ENCOUNTER — Encounter: Payer: Self-pay | Admitting: Internal Medicine

## 2020-11-28 VITALS — BP 130/74 | HR 57 | Ht 70.0 in | Wt 245.0 lb

## 2020-11-28 DIAGNOSIS — N200 Calculus of kidney: Secondary | ICD-10-CM | POA: Insufficient documentation

## 2020-11-28 DIAGNOSIS — I712 Thoracic aortic aneurysm, without rupture, unspecified: Secondary | ICD-10-CM

## 2020-11-28 DIAGNOSIS — I25118 Atherosclerotic heart disease of native coronary artery with other forms of angina pectoris: Secondary | ICD-10-CM

## 2020-11-28 DIAGNOSIS — E782 Mixed hyperlipidemia: Secondary | ICD-10-CM | POA: Diagnosis not present

## 2020-11-28 DIAGNOSIS — Z125 Encounter for screening for malignant neoplasm of prostate: Secondary | ICD-10-CM | POA: Diagnosis not present

## 2020-11-28 DIAGNOSIS — Z0000 Encounter for general adult medical examination without abnormal findings: Secondary | ICD-10-CM | POA: Diagnosis not present

## 2020-11-28 DIAGNOSIS — G4700 Insomnia, unspecified: Secondary | ICD-10-CM | POA: Insufficient documentation

## 2020-11-28 DIAGNOSIS — Z6835 Body mass index (BMI) 35.0-35.9, adult: Secondary | ICD-10-CM

## 2020-11-28 DIAGNOSIS — K219 Gastro-esophageal reflux disease without esophagitis: Secondary | ICD-10-CM | POA: Insufficient documentation

## 2020-11-28 DIAGNOSIS — I10 Essential (primary) hypertension: Secondary | ICD-10-CM

## 2020-11-28 DIAGNOSIS — F528 Other sexual dysfunction not due to a substance or known physiological condition: Secondary | ICD-10-CM | POA: Insufficient documentation

## 2020-11-28 DIAGNOSIS — M7710 Lateral epicondylitis, unspecified elbow: Secondary | ICD-10-CM | POA: Insufficient documentation

## 2020-11-28 LAB — POCT URINALYSIS DIPSTICK
Bilirubin, UA: NEGATIVE
Blood, UA: NEGATIVE
Glucose, UA: NEGATIVE
Ketones, UA: NEGATIVE
Leukocytes, UA: NEGATIVE
Nitrite, UA: NEGATIVE
Protein, UA: NEGATIVE
Spec Grav, UA: 1.015 (ref 1.010–1.025)
Urobilinogen, UA: 0.2 E.U./dL
pH, UA: 5 (ref 5.0–8.0)

## 2020-11-28 NOTE — Progress Notes (Signed)
Date:  11/28/2020   Name:  Chad Choi   DOB:  21-Sep-1956   MRN:  [id number]   Chief Complaint: Annual Exam  Chad Choi is a 65 y.o. male who presents today for his Complete Annual Exam. He feels well. He reports exercising - walking at work. He reports he is sleeping fairly well with CPAP.   Colonoscopy: 01/2020 repeat 7 yrs  Immunization History  Administered Date(s) Administered  . Influenza-Unspecified 07/02/2017, 07/07/2018, 06/24/2019, 07/13/2020  . PFIZER(Purple Top)SARS-COV-2 Vaccination 11/03/2019, 11/23/2019, 07/10/2020  . Pneumococcal Conjugate-13 06/04/2015  . Pneumococcal Polysaccharide-23 02/04/2014, 10/27/2014, 05/07/2020  . Td 05/07/2020  . Tdap 09/07/2018  . Zoster Recombinat (Shingrix) 12/17/2018, 12/22/2019    Hypertension This is a chronic problem. The problem is controlled. Pertinent negatives include no chest pain, headaches, palpitations or shortness of breath. Past treatments include ACE inhibitors and calcium channel blockers. The current treatment provides moderate improvement.  Hyperlipidemia This is a chronic problem. The problem is resistant. Pertinent negatives include no chest pain, focal weakness, myalgias or shortness of breath. Current antihyperlipidemic treatment includes statins (atorvastatin increased to 40 mg). The current treatment provides moderate improvement of lipids.    Lab Results  Component Value Date   CREATININE 0.94 11/25/2019   BUN 14 11/25/2019   NA 142 11/25/2019   K 4.5 11/25/2019   CL 102 11/25/2019   CO2 23 11/25/2019   Lab Results  Component Value Date   CHOL 154 11/25/2019   HDL 43 11/25/2019   LDLCALC 85 11/25/2019   TRIG 152 (H) 11/25/2019   CHOLHDL 3.6 11/25/2019   Lab Results  Component Value Date   TSH 0.683 11/25/2019   No results found for: HGBA1C Lab Results  Component Value Date   WBC 5.1 11/25/2019   HGB 15.8 11/25/2019   HCT 47.2 11/25/2019   MCV 84 11/25/2019   PLT 233 11/25/2019    Lab Results  Component Value Date   ALT 24 11/25/2019   AST 22 11/25/2019   ALKPHOS 77 11/25/2019   BILITOT 0.9 11/25/2019     Review of Systems  Constitutional: Negative for appetite change, chills, diaphoresis, fatigue and unexpected weight change.  HENT: Negative for hearing loss, tinnitus, trouble swallowing and voice change.   Eyes: Negative for visual disturbance.  Respiratory: Negative for choking, shortness of breath and wheezing.   Cardiovascular: Negative for chest pain, palpitations and leg swelling.  Gastrointestinal: Negative for abdominal pain, blood in stool, constipation and diarrhea.  Genitourinary: Negative for difficulty urinating, dysuria and frequency.  Musculoskeletal: Positive for arthralgias and back pain. Negative for myalgias.  Skin: Negative for color change and rash.  Neurological: Negative for dizziness, focal weakness, syncope and headaches.  Hematological: Negative for adenopathy.  Psychiatric/Behavioral: Negative for dysphoric mood and sleep disturbance.    Patient Active Problem List   Diagnosis Date Noted  . Psychosexual dysfunction with inhibited sexual excitement 11/28/2020  . Lateral epicondylitis of elbow 11/28/2020  . Calculus of kidney 11/28/2020  . Thoracic aortic aneurysm without rupture (HCC) 11/28/2020  . Insomnia disorder 11/28/2020  . Gastroesophageal reflux disease 11/28/2020  . Chronic kidney disease, stage 2 (mild) 09/25/2020  . Coronary artery disease of native artery of native heart with stable angina pectoris (HCC) 01/29/2020  . Current moderate episode of major depressive disorder without prior episode (HCC) 11/25/2019  . Migraine without aura and without status migrainosus, not intractable 08/08/2019  . Aortic stenosis 05/25/2019  . Vitamin D deficiency 03/09/2019  . History of atrial fibrillation  03/09/2019  . Severe obesity (HCC) 09/07/2018  . History of arthroplasty of right ankle 07/05/2018  . Degeneration of  intervertebral disc of cervical region 04/20/2018  . Hyperlipidemia 04/20/2018  . Essential hypertension 04/20/2018  . Hx of adenomatous colonic polyps 04/20/2018  . OSA on CPAP 04/20/2018  . Primary osteoarthritis of both hands 06/06/2016  . Other age-related incipient cataract, bilateral 05/13/2016    Allergies  Allergen Reactions  . Naproxen Hives  . Sulfa Antibiotics Hives and Other (See Comments)    Past Surgical History:  Procedure Laterality Date  . TOTAL ANKLE REPLACEMENT Right 03/2018    Social History   Tobacco Use  . Smoking status: Former Smoker    Packs/day: 1.50    Years: 16.00    Pack years: 24.00    Types: Cigarettes    Quit date: 03/28/1987    Years since quitting: 33.6  . Smokeless tobacco: Former Neurosurgeon    Quit date: 1989  Substance Use Topics  . Alcohol use: Never  . Drug use: Never     Medication list has been reviewed and updated.  Current Meds  Medication Sig  . Acetaminophen 325 MG CAPS Take by mouth as needed.   Marland Kitchen amLODipine (NORVASC) 5 MG tablet Take 1 tablet (5 mg total) by mouth daily.  Marland Kitchen atorvastatin (LIPITOR) 40 MG tablet Take 40 mg by mouth daily.  . Cholecalciferol 25 MCG (1000 UT) tablet Take 2 tablets by mouth daily.  . cyanocobalamin 1000 MCG tablet Take 1 tablet by mouth daily.  . cyclobenzaprine (FLEXERIL) 10 MG tablet TAKE 1 TABLET NIGHTLY AS NEEDED FOR MUSCLE SPAMS  . ibuprofen (ADVIL) 600 MG tablet Take by mouth.  Marland Kitchen lisinopril (ZESTRIL) 40 MG tablet Use as directed 40 mg in the mouth or throat daily.  . NON FORMULARY CPAP nightly  . omeprazole (PRILOSEC) 20 MG capsule TAKE ONE CAPSULE BY MOUTH TWO TIMES A DAY TO CONTROL STOMACH ACID.  TAKE 30 MINUTES BEFORE A MEAL  . oxyCODONE (OXY IR/ROXICODONE) 5 MG immediate release tablet Take 5 mg by mouth every 6 (six) hours.  Marland Kitchen zolpidem (AMBIEN CR) 12.5 MG CR tablet Take 12.5 mg by mouth daily.    PHQ 2/9 Scores 11/28/2020 07/24/2020 04/30/2020 12/06/2019  PHQ - 2 Score 0 0 0 0  PHQ- 9  Score 0 0 2 0    GAD 7 : Generalized Anxiety Score 11/28/2020 07/24/2020 04/30/2020 12/06/2019  Nervous, Anxious, on Edge 0 0 0 0  Control/stop worrying 0 0 0 0  Worry too much - different things 0 0 0 0  Trouble relaxing 0 0 0 0  Restless 0 0 0 0  Easily annoyed or irritable 0 0 0 0  Afraid - awful might happen 0 0 0 0  Total GAD 7 Score 0 0 0 0  Anxiety Difficulty - Not difficult at all Not difficult at all Not difficult at all    BP Readings from Last 3 Encounters:  11/28/20 130/74  07/24/20 (!) 160/98  04/30/20 (!) 142/84    Physical Exam Vitals and nursing note reviewed.  Constitutional:      Appearance: Normal appearance. He is well-developed.  HENT:     Head: Normocephalic.     Right Ear: Tympanic membrane, ear canal and external ear normal.     Left Ear: Tympanic membrane, ear canal and external ear normal.     Nose: Nose normal.  Eyes:     Conjunctiva/sclera: Conjunctivae normal.     Pupils: Pupils are  equal, round, and reactive to light.  Neck:     Thyroid: No thyromegaly.     Vascular: No carotid bruit.  Cardiovascular:     Rate and Rhythm: Normal rate and regular rhythm.     Heart sounds: Normal heart sounds.  Pulmonary:     Effort: Pulmonary effort is normal.     Breath sounds: Normal breath sounds. No wheezing.  Chest:  Breasts:     Right: No mass.     Left: No mass.    Abdominal:     General: Bowel sounds are normal.     Palpations: Abdomen is soft.     Tenderness: There is no abdominal tenderness.  Musculoskeletal:     Cervical back: Normal range of motion and neck supple.     Right ankle: Decreased range of motion (since ankle replacement).  Lymphadenopathy:     Cervical: No cervical adenopathy.  Skin:    General: Skin is warm and dry.  Neurological:     Mental Status: He is alert and oriented to person, place, and time.     Deep Tendon Reflexes: Reflexes are normal and symmetric.  Psychiatric:        Attention and Perception: Attention  normal.        Mood and Affect: Mood normal.        Behavior: Behavior normal.        Thought Content: Thought content normal.     Wt Readings from Last 3 Encounters:  11/28/20 245 lb (111.1 kg)  07/24/20 241 lb (109.3 kg)  04/30/20 247 lb (112 kg)    BP 130/74   Pulse (!) 57   Ht 5\' 10"  (1.778 m)   Wt 245 lb (111.1 kg)   SpO2 97%   BMI 35.15 kg/m   Assessment and Plan: 1. Annual physical exam Exam is normal except for weight. Encourage regular exercise and appropriate dietary changes.  2. Prostate cancer screening DRE deferred - PSA  3. Essential hypertension Clinically stable exam with well controlled BP. Tolerating medications without side effects at this time. Pt to continue current regimen and low sodium diet; benefits of regular exercise as able discussed. - CBC with Differential/Platelet - Comprehensive metabolic panel - POCT urinalysis dipstick  4. Mixed hyperlipidemia On statin therapy - - Lipid panel  5. Coronary artery disease of native artery of native heart with stable angina pectoris (HCC) Stable without chest pain or SOB Not followed closely by cardiology  6. BMI 35.0-35.9,adult Continue to work on diet changes  7. Thoracic aortic aneurysm without rupture (HCC) I can not find any studies to substantiate this dx CTs of the chest from 2020 do not mention so will delete from hx   Partially dictated using Dragon software. Any errors are unintentional.  2021, MD [facility]  11/28/2020

## 2020-11-29 LAB — COMPREHENSIVE METABOLIC PANEL
ALT: 30 IU/L (ref 0–44)
AST: 19 IU/L (ref 0–40)
Albumin/Globulin Ratio: 1.8 (ref 1.2–2.2)
Albumin: 4.4 g/dL (ref 3.8–4.8)
Alkaline Phosphatase: 58 IU/L (ref 44–121)
BUN/Creatinine Ratio: 19 (ref 10–24)
BUN: 17 mg/dL (ref 8–27)
Bilirubin Total: 1 mg/dL (ref 0.0–1.2)
CO2: 24 mmol/L (ref 20–29)
Calcium: 9.4 mg/dL (ref 8.6–10.2)
Chloride: 102 mmol/L (ref 96–106)
Creatinine, Ser: 0.88 mg/dL (ref 0.76–1.27)
GFR calc Af Amer: 103 mL/min/{1.73_m2} (ref >59)
GFR calc non Af Amer: 90 mL/min/{1.73_m2} (ref >59)
Globulin, Total: 2.5 g/dL (ref 1.5–4.5)
Glucose: 89 mg/dL (ref 65–99)
Potassium: 4.8 mmol/L (ref 3.5–5.2)
Sodium: 141 mmol/L (ref 134–144)
Total Protein: 6.9 g/dL (ref 6.0–8.5)

## 2020-11-29 LAB — CBC WITH DIFFERENTIAL/PLATELET
Basophils Absolute: 0 10*3/uL (ref 0.0–0.2)
Basos: 0 %
EOS (ABSOLUTE): 0.1 10*3/uL (ref 0.0–0.4)
Eos: 1 %
Hematocrit: 46.4 % (ref 37.5–51.0)
Hemoglobin: 15.7 g/dL (ref 13.0–17.7)
Immature Grans (Abs): 0.1 10*3/uL (ref 0.0–0.1)
Immature Granulocytes: 1 %
Lymphocytes Absolute: 1.4 10*3/uL (ref 0.7–3.1)
Lymphs: 11 %
MCH: 28.9 pg (ref 26.6–33.0)
MCHC: 33.8 g/dL (ref 31.5–35.7)
MCV: 86 fL (ref 79–97)
Monocytes Absolute: 1 10*3/uL — ABNORMAL HIGH (ref 0.1–0.9)
Monocytes: 8 %
Neutrophils Absolute: 10.1 10*3/uL — ABNORMAL HIGH (ref 1.4–7.0)
Neutrophils: 79 %
Platelets: 304 10*3/uL (ref 150–450)
RBC: 5.43 x10E6/uL (ref 4.14–5.80)
RDW: 13.5 % (ref 11.6–15.4)
WBC: 12.7 10*3/uL — ABNORMAL HIGH (ref 3.4–10.8)

## 2020-11-29 LAB — LIPID PANEL
Chol/HDL Ratio: 2.4 ratio (ref 0.0–5.0)
Cholesterol, Total: 147 mg/dL (ref 100–199)
HDL: 61 mg/dL (ref >39)
LDL Chol Calc (NIH): 69 mg/dL (ref 0–99)
Triglycerides: 88 mg/dL (ref 0–149)
VLDL Cholesterol Cal: 17 mg/dL (ref 5–40)

## 2020-11-29 LAB — PSA: Prostate Specific Ag, Serum: 1.1 ng/mL (ref 0.0–4.0)

## 2020-12-02 ENCOUNTER — Encounter: Payer: Self-pay | Admitting: Internal Medicine

## 2020-12-06 ENCOUNTER — Other Ambulatory Visit: Payer: Self-pay | Admitting: Internal Medicine

## 2020-12-06 DIAGNOSIS — I10 Essential (primary) hypertension: Secondary | ICD-10-CM

## 2021-01-15 DIAGNOSIS — R030 Elevated blood-pressure reading, without diagnosis of hypertension: Secondary | ICD-10-CM | POA: Diagnosis not present

## 2021-01-15 DIAGNOSIS — I10 Essential (primary) hypertension: Secondary | ICD-10-CM | POA: Diagnosis not present

## 2021-01-15 DIAGNOSIS — I25118 Atherosclerotic heart disease of native coronary artery with other forms of angina pectoris: Secondary | ICD-10-CM | POA: Diagnosis not present

## 2021-01-15 DIAGNOSIS — I350 Nonrheumatic aortic (valve) stenosis: Secondary | ICD-10-CM | POA: Diagnosis not present

## 2021-01-17 DIAGNOSIS — M75101 Unspecified rotator cuff tear or rupture of right shoulder, not specified as traumatic: Secondary | ICD-10-CM | POA: Diagnosis not present

## 2021-01-17 DIAGNOSIS — M12811 Other specific arthropathies, not elsewhere classified, right shoulder: Secondary | ICD-10-CM | POA: Diagnosis not present

## 2021-01-17 DIAGNOSIS — M19011 Primary osteoarthritis, right shoulder: Secondary | ICD-10-CM | POA: Diagnosis not present

## 2021-01-17 DIAGNOSIS — M7501 Adhesive capsulitis of right shoulder: Secondary | ICD-10-CM | POA: Diagnosis not present

## 2021-01-20 ENCOUNTER — Encounter: Payer: Self-pay | Admitting: Internal Medicine

## 2021-01-22 ENCOUNTER — Other Ambulatory Visit: Payer: Self-pay

## 2021-01-22 ENCOUNTER — Encounter: Payer: Self-pay | Admitting: Internal Medicine

## 2021-01-22 ENCOUNTER — Ambulatory Visit: Payer: BC Managed Care – PPO | Admitting: Internal Medicine

## 2021-01-22 VITALS — BP 138/80 | HR 62 | Temp 98.0°F | Ht 70.0 in | Wt 243.0 lb

## 2021-01-22 DIAGNOSIS — G8929 Other chronic pain: Secondary | ICD-10-CM

## 2021-01-22 DIAGNOSIS — M25571 Pain in right ankle and joints of right foot: Secondary | ICD-10-CM | POA: Diagnosis not present

## 2021-01-22 DIAGNOSIS — F411 Generalized anxiety disorder: Secondary | ICD-10-CM | POA: Diagnosis not present

## 2021-01-22 MED ORDER — DULOXETINE HCL 60 MG PO CPEP: 60.0000 mg | ORAL_CAPSULE | Freq: Every day | ORAL | 1 refills | Status: DC

## 2021-01-22 NOTE — Progress Notes (Signed)
Date:  01/22/2021   Name:  Chad Choi   DOB:  1955/11/17   MRN:  [id number]   Chief Complaint: Anxiety, Depression, and Insomnia  Depression        This is a chronic (first treated with medication 11/2019) problem.  The problem occurs daily.  The problem has been gradually worsening since onset.  Associated symptoms include decreased concentration, helplessness, hopelessness, insomnia, decreased interest, appetite change and suicidal ideas.  Associated symptoms include no headaches.  Past treatments include SSRIs - Selective serotonin reuptake inhibitors (had excellent response to Lexapro but pt elected to stop it in July 2021.).   Lab Results  Component Value Date   CREATININE 0.88 11/28/2020   BUN 17 11/28/2020   NA 141 11/28/2020   K 4.8 11/28/2020   CL 102 11/28/2020   CO2 24 11/28/2020   Lab Results  Component Value Date   CHOL 147 11/28/2020   HDL 61 11/28/2020   LDLCALC 69 11/28/2020   TRIG 88 11/28/2020   CHOLHDL 2.4 11/28/2020   Lab Results  Component Value Date   TSH 0.683 11/25/2019   No results found for: HGBA1C Lab Results  Component Value Date   WBC 12.7 (H) 11/28/2020   HGB 15.7 11/28/2020   HCT 46.4 11/28/2020   MCV 86 11/28/2020   PLT 304 11/28/2020   Lab Results  Component Value Date   ALT 30 11/28/2020   AST 19 11/28/2020   ALKPHOS 58 11/28/2020   BILITOT 1.0 11/28/2020     Review of Systems  Constitutional: Positive for appetite change. Negative for chills, fever and unexpected weight change.  Respiratory: Negative for chest tightness and shortness of breath.   Cardiovascular: Negative for chest pain.  Musculoskeletal: Positive for arthralgias (in right ankle since surgery) and gait problem.  Neurological: Negative for dizziness, light-headedness and headaches.  Psychiatric/Behavioral: Positive for decreased concentration, depression, dysphoric mood and suicidal ideas. The patient is nervous/anxious and has insomnia.     Patient  Active Problem List   Diagnosis Date Noted  . Psychosexual dysfunction with inhibited sexual excitement 11/28/2020  . Lateral epicondylitis of elbow 11/28/2020  . Calculus of kidney 11/28/2020  . Insomnia disorder 11/28/2020  . Gastroesophageal reflux disease 11/28/2020  . Chronic kidney disease, stage 2 (mild) 09/25/2020  . Coronary artery disease of native artery of native heart with stable angina pectoris (HCC) 01/29/2020  . Migraine without aura and without status migrainosus, not intractable 08/08/2019  . Aortic stenosis 05/25/2019  . Vitamin D deficiency 03/09/2019  . History of atrial fibrillation 03/09/2019  . History of arthroplasty of right ankle 07/05/2018  . Degeneration of intervertebral disc of cervical region 04/20/2018  . Hyperlipidemia 04/20/2018  . Essential hypertension 04/20/2018  . Hx of adenomatous colonic polyps 04/20/2018  . OSA on CPAP 04/20/2018  . Primary osteoarthritis of both hands 06/06/2016  . Other age-related incipient cataract, bilateral 05/13/2016    Allergies  Allergen Reactions  . Naproxen Hives  . Sulfa Antibiotics Hives and Other (See Comments)    Past Surgical History:  Procedure Laterality Date  . TOTAL ANKLE REPLACEMENT Right 03/2018    Social History   Tobacco Use  . Smoking status: Former Smoker    Packs/day: 1.50    Years: 16.00    Pack years: 24.00    Types: Cigarettes    Quit date: 03/28/1987    Years since quitting: 33.8  . Smokeless tobacco: Former Neurosurgeon    Quit date: 1989  Substance Use Topics  .  Alcohol use: Never  . Drug use: Never     Medication list has been reviewed and updated.  Current Meds  Medication Sig  . Acetaminophen 325 MG CAPS Take by mouth as needed.   Marland Kitchen amLODipine (NORVASC) 5 MG tablet TAKE 1 TABLET BY MOUTH EVERY DAY  . atorvastatin (LIPITOR) 40 MG tablet Take 40 mg by mouth daily.  . Cholecalciferol 25 MCG (1000 UT) tablet Take 2 tablets by mouth daily.  . cyanocobalamin 1000 MCG tablet Take  1 tablet by mouth daily.  . cyclobenzaprine (FLEXERIL) 10 MG tablet TAKE 1 TABLET NIGHTLY AS NEEDED FOR MUSCLE SPAMS  . gabapentin (NEURONTIN) 300 MG capsule Take 300 mg by mouth at bedtime.  Marland Kitchen ibuprofen (ADVIL) 600 MG tablet Take by mouth.  Marland Kitchen lisinopril (ZESTRIL) 40 MG tablet Use as directed 40 mg in the mouth or throat daily.  . NON FORMULARY CPAP nightly  . omeprazole (PRILOSEC) 20 MG capsule TAKE ONE CAPSULE BY MOUTH TWO TIMES A DAY TO CONTROL STOMACH ACID.  TAKE 30 MINUTES BEFORE A MEAL  . oxyCODONE (OXY IR/ROXICODONE) 5 MG immediate release tablet Take 5 mg by mouth every 6 (six) hours.    PHQ 2/9 Scores 01/22/2021 11/28/2020 07/24/2020 04/30/2020  PHQ - 2 Score 2 0 0 0  PHQ- 9 Score 9 0 0 2    GAD 7 : Generalized Anxiety Score 01/22/2021 11/28/2020 07/24/2020 04/30/2020  Nervous, Anxious, on Edge 3 0 0 0  Control/stop worrying 3 0 0 0  Worry too much - different things 3 0 0 0  Trouble relaxing 2 0 0 0  Restless 2 0 0 0  Easily annoyed or irritable 3 0 0 0  Afraid - awful might happen 3 0 0 0  Total GAD 7 Score 19 0 0 0  Anxiety Difficulty Very difficult - Not difficult at all Not difficult at all    BP Readings from Last 3 Encounters:  01/22/21 138/80  11/28/20 130/74  07/24/20 (!) 160/98    Physical Exam Vitals and nursing note reviewed.  Constitutional:      General: He is not in acute distress.    Appearance: He is well-developed.  HENT:     Head: Normocephalic and atraumatic.  Cardiovascular:     Rate and Rhythm: Normal rate and regular rhythm.     Pulses: Normal pulses.     Heart sounds: No murmur heard.   Pulmonary:     Effort: Pulmonary effort is normal. No respiratory distress.     Breath sounds: No wheezing or rhonchi.  Musculoskeletal:     Cervical back: Normal range of motion and neck supple.     Right lower leg: No edema.     Left lower leg: No edema.  Skin:    General: Skin is warm and dry.     Findings: No rash.  Neurological:     Mental  Status: He is alert and oriented to person, place, and time.  Psychiatric:        Mood and Affect: Mood is anxious and depressed.        Speech: Speech normal.        Behavior: Behavior normal.     Wt Readings from Last 3 Encounters:  01/22/21 243 lb (110.2 kg)  11/28/20 245 lb (111.1 kg)  07/24/20 241 lb (109.3 kg)    BP 138/80   Pulse 62   Temp 98 F (36.7 C) (Oral)   Ht 5\' 10"  (1.778 m)   Wt  243 lb (110.2 kg)   SpO2 98%   BMI 34.87 kg/m   Assessment and Plan: 1. Generalized anxiety disorder Exacerbated by stress at work and possibly chronic ankle pain He will discontinue Ambien Continue Gabapentin and melatonin at bedtime which has been working well. Follow up before his July appt if needed - DULoxetine (CYMBALTA) 60 MG capsule; Take 1 capsule (60 mg total) by mouth daily.  Dispense: 30 capsule; Refill: 1  2. Chronic pain of right ankle Cymbalta may help with the chronic pain.   Partially dictated using Animal nutritionist. Any errors are unintentional.  Bari Edward, MD [facility]  01/22/2021

## 2021-02-01 DIAGNOSIS — R030 Elevated blood-pressure reading, without diagnosis of hypertension: Secondary | ICD-10-CM | POA: Diagnosis not present

## 2021-02-05 DIAGNOSIS — I350 Nonrheumatic aortic (valve) stenosis: Secondary | ICD-10-CM | POA: Diagnosis not present

## 2021-02-12 ENCOUNTER — Encounter: Payer: Self-pay | Admitting: Internal Medicine

## 2021-02-12 NOTE — Telephone Encounter (Signed)
For your information  

## 2021-02-14 ENCOUNTER — Other Ambulatory Visit: Payer: Self-pay | Admitting: Internal Medicine

## 2021-02-14 DIAGNOSIS — F411 Generalized anxiety disorder: Secondary | ICD-10-CM

## 2021-02-14 NOTE — Telephone Encounter (Signed)
Requested medication (s) are due for refill today:   Yes  Requested medication (s) are on the active medication list:   Yes  Future visit scheduled:   Yes   Last ordered: 01/22/2021 #30, 1 refill  Pharmacy requesting a 90 day supply and a DX Code  Requested Prescriptions  Pending Prescriptions Disp Refills   DULoxetine (CYMBALTA) 60 MG capsule [Pharmacy Med Name: DULOXETINE HCL DR 60 MG CAP] 90 capsule 1    Sig: TAKE 1 CAPSULE BY MOUTH EVERY DAY      Psychiatry: Antidepressants - SNRI Passed - 02/14/2021 12:32 PM      Passed - Last BP in normal range    BP Readings from Last 1 Encounters:  01/22/21 138/80          Passed - Valid encounter within last 6 months    Recent Outpatient Visits           3 weeks ago Generalized anxiety disorder   [facility] Reubin Milan, MD   2 months ago Annual physical exam   [facility] Reubin Milan, MD   6 months ago Essential hypertension   [facility] Reubin Milan, MD   9 months ago Cellulitis of left lower extremity   [facility] Re[facility] Milan, MD   1 year ago Essential hypertension   [facility] Reubin Milan, MD       Future Appointments             In 2 months Judithann Graves Nyoka Cowden, MD [facility]   In 9 months Judithann Graves Nyoka Cowden, MD [facility], [facility]

## 2021-02-15 NOTE — Telephone Encounter (Signed)
Please advise if okay for 90 day supply refill.

## 2021-02-19 DIAGNOSIS — Z79899 Other long term (current) drug therapy: Secondary | ICD-10-CM | POA: Diagnosis not present

## 2021-02-19 DIAGNOSIS — Z5181 Encounter for therapeutic drug level monitoring: Secondary | ICD-10-CM | POA: Diagnosis not present

## 2021-02-19 DIAGNOSIS — G894 Chronic pain syndrome: Secondary | ICD-10-CM | POA: Diagnosis not present

## 2021-02-19 DIAGNOSIS — M791 Myalgia, unspecified site: Secondary | ICD-10-CM | POA: Diagnosis not present

## 2021-02-19 DIAGNOSIS — M79673 Pain in unspecified foot: Secondary | ICD-10-CM | POA: Diagnosis not present

## 2021-02-19 DIAGNOSIS — M5416 Radiculopathy, lumbar region: Secondary | ICD-10-CM | POA: Diagnosis not present

## 2021-03-07 DIAGNOSIS — H2513 Age-related nuclear cataract, bilateral: Secondary | ICD-10-CM | POA: Diagnosis not present

## 2021-03-22 DIAGNOSIS — Z23 Encounter for immunization: Secondary | ICD-10-CM | POA: Diagnosis not present

## 2021-04-09 ENCOUNTER — Encounter: Payer: Self-pay | Admitting: Internal Medicine

## 2021-04-16 DIAGNOSIS — I25118 Atherosclerotic heart disease of native coronary artery with other forms of angina pectoris: Secondary | ICD-10-CM | POA: Diagnosis not present

## 2021-04-16 DIAGNOSIS — I10 Essential (primary) hypertension: Secondary | ICD-10-CM | POA: Diagnosis not present

## 2021-04-16 DIAGNOSIS — I350 Nonrheumatic aortic (valve) stenosis: Secondary | ICD-10-CM | POA: Diagnosis not present

## 2021-04-30 DIAGNOSIS — I10 Essential (primary) hypertension: Secondary | ICD-10-CM | POA: Diagnosis not present

## 2021-05-07 ENCOUNTER — Ambulatory Visit: Payer: BC Managed Care – PPO | Admitting: Internal Medicine

## 2021-05-14 ENCOUNTER — Ambulatory Visit: Payer: BC Managed Care – PPO | Admitting: Internal Medicine

## 2021-05-17 DIAGNOSIS — M791 Myalgia, unspecified site: Secondary | ICD-10-CM | POA: Diagnosis not present

## 2021-05-17 DIAGNOSIS — M79673 Pain in unspecified foot: Secondary | ICD-10-CM | POA: Diagnosis not present

## 2021-05-17 DIAGNOSIS — G894 Chronic pain syndrome: Secondary | ICD-10-CM | POA: Diagnosis not present

## 2021-05-17 DIAGNOSIS — M5416 Radiculopathy, lumbar region: Secondary | ICD-10-CM | POA: Diagnosis not present

## 2021-05-23 DIAGNOSIS — M75101 Unspecified rotator cuff tear or rupture of right shoulder, not specified as traumatic: Secondary | ICD-10-CM | POA: Diagnosis not present

## 2021-05-23 DIAGNOSIS — M19011 Primary osteoarthritis, right shoulder: Secondary | ICD-10-CM | POA: Diagnosis not present

## 2021-05-23 DIAGNOSIS — M25511 Pain in right shoulder: Secondary | ICD-10-CM | POA: Diagnosis not present

## 2021-05-23 DIAGNOSIS — M7501 Adhesive capsulitis of right shoulder: Secondary | ICD-10-CM | POA: Diagnosis not present

## 2021-05-27 DIAGNOSIS — M19041 Primary osteoarthritis, right hand: Secondary | ICD-10-CM | POA: Diagnosis not present

## 2021-05-27 DIAGNOSIS — M19042 Primary osteoarthritis, left hand: Secondary | ICD-10-CM | POA: Diagnosis not present

## 2021-05-31 ENCOUNTER — Encounter: Payer: Self-pay | Admitting: Internal Medicine

## 2021-05-31 ENCOUNTER — Ambulatory Visit: Payer: BC Managed Care – PPO | Admitting: Internal Medicine

## 2021-05-31 ENCOUNTER — Other Ambulatory Visit: Payer: Self-pay

## 2021-05-31 VITALS — BP 138/82 | HR 60 | Temp 97.7°F | Ht 70.0 in | Wt 245.0 lb

## 2021-05-31 DIAGNOSIS — F411 Generalized anxiety disorder: Secondary | ICD-10-CM

## 2021-05-31 DIAGNOSIS — I10 Essential (primary) hypertension: Secondary | ICD-10-CM | POA: Diagnosis not present

## 2021-05-31 NOTE — Progress Notes (Signed)
Date:  05/31/2021   Name:  Chad Choi   DOB:  06/12/1956   MRN:  [id number]   Chief Complaint: Hypertension  Hypertension This is a chronic problem. The problem is controlled. Associated symptoms include anxiety and peripheral edema. Pertinent negatives include no chest pain, headaches, palpitations or shortness of breath. Past treatments include calcium channel blockers, diuretics and ACE inhibitors (amlodipine reduced to 5 mg and spironolactone added). The current treatment provides significant improvement.  Anxiety Presents for follow-up visit. Patient reports no chest pain, dizziness, palpitations or shortness of breath. The quality of sleep is good.     Lab Results  Component Value Date   CREATININE 0.88 11/28/2020   BUN 17 11/28/2020   NA 141 11/28/2020   K 4.8 11/28/2020   CL 102 11/28/2020   CO2 24 11/28/2020   Lab Results  Component Value Date   CHOL 147 11/28/2020   HDL 61 11/28/2020   LDLCALC 69 11/28/2020   TRIG 88 11/28/2020   CHOLHDL 2.4 11/28/2020   Lab Results  Component Value Date   TSH 0.683 11/25/2019   No results found for: HGBA1C Lab Results  Component Value Date   WBC 12.7 (H) 11/28/2020   HGB 15.7 11/28/2020   HCT 46.4 11/28/2020   MCV 86 11/28/2020   PLT 304 11/28/2020   Lab Results  Component Value Date   ALT 30 11/28/2020   AST 19 11/28/2020   ALKPHOS 58 11/28/2020   BILITOT 1.0 11/28/2020     Review of Systems  Constitutional:  Negative for fatigue and unexpected weight change.  HENT:  Negative for nosebleeds.   Eyes:  Negative for visual disturbance.  Respiratory:  Negative for cough, chest tightness, shortness of breath and wheezing.   Cardiovascular:  Negative for chest pain, palpitations and leg swelling.  Gastrointestinal:  Negative for abdominal pain, constipation and diarrhea.  Neurological:  Negative for dizziness, weakness, light-headedness and headaches.   Patient Active Problem List   Diagnosis Date Noted    Generalized anxiety disorder 05/31/2021   Psychosexual dysfunction with inhibited sexual excitement 11/28/2020   Lateral epicondylitis of elbow 11/28/2020   Calculus of kidney 11/28/2020   Insomnia disorder 11/28/2020   Gastroesophageal reflux disease 11/28/2020   Chronic kidney disease, stage 2 (mild) 09/25/2020   Coronary artery disease of native artery of native heart with stable angina pectoris (HCC) 01/29/2020   Migraine without aura and without status migrainosus, not intractable 08/08/2019   Aortic stenosis 05/25/2019   Vitamin D deficiency 03/09/2019   History of atrial fibrillation 03/09/2019   History of arthroplasty of right ankle 07/05/2018   Degeneration of intervertebral disc of cervical region 04/20/2018   Hyperlipidemia 04/20/2018   Essential hypertension 04/20/2018   Hx of adenomatous colonic polyps 04/20/2018   OSA on CPAP 04/20/2018   Primary osteoarthritis of both hands 06/06/2016   Other age-related incipient cataract, bilateral 05/13/2016    Allergies  Allergen Reactions   Naproxen Hives   Sulfa Antibiotics Hives and Other (See Comments)    Past Surgical History:  Procedure Laterality Date   TOTAL ANKLE REPLACEMENT Right 03/2018    Social History   Tobacco Use   Smoking status: Former    Packs/day: 1.50    Years: 16.00    Pack years: 24.00    Types: Cigarettes    Quit date: 03/28/1987    Years since quitting: 34.2   Smokeless tobacco: Former    Quit date: 1989  Substance Use Topics   Alcohol  use: Never   Drug use: Never     Medication list has been reviewed and updated.  Current Meds  Medication Sig   Acetaminophen 325 MG CAPS Take by mouth as needed.    amLODipine (NORVASC) 5 MG tablet TAKE 1 TABLET BY MOUTH EVERY DAY   atorvastatin (LIPITOR) 40 MG tablet Take 40 mg by mouth daily.   Cholecalciferol 25 MCG (1000 UT) tablet Take 2 tablets by mouth daily.   cyanocobalamin 1000 MCG tablet Take 1 tablet by mouth daily.   cyclobenzaprine  (FLEXERIL) 10 MG tablet TAKE 1 TABLET NIGHTLY AS NEEDED FOR MUSCLE SPAMS   DULoxetine (CYMBALTA) 60 MG capsule TAKE 1 CAPSULE BY MOUTH EVERY DAY   gabapentin (NEURONTIN) 300 MG capsule Take 300 mg by mouth at bedtime.   ibuprofen (ADVIL) 600 MG tablet Take 300 mg by mouth.   lisinopril (ZESTRIL) 40 MG tablet Use as directed 40 mg in the mouth or throat daily.   NON FORMULARY CPAP nightly   omeprazole (PRILOSEC) 20 MG capsule TAKE ONE CAPSULE BY MOUTH TWO TIMES A DAY TO CONTROL STOMACH ACID.  TAKE 30 MINUTES BEFORE A MEAL   oxyCODONE (OXY IR/ROXICODONE) 5 MG immediate release tablet Take 5 mg by mouth every 6 (six) hours.   spironolactone (ALDACTONE) 25 MG tablet Take 25 mg by mouth daily.    PHQ 2/9 Scores 05/31/2021 05/31/2021 01/22/2021 11/28/2020  PHQ - 2 Score 5 0 2 0  PHQ- 9 Score 15 2 9  0    GAD 7 : Generalized Anxiety Score 05/31/2021 05/31/2021 01/22/2021 11/28/2020  Nervous, Anxious, on Edge 2 0 3 0  Control/stop worrying 2 0 3 0  Worry too much - different things 2 0 3 0  Trouble relaxing 3 0 2 0  Restless 3 0 2 0  Easily annoyed or irritable 0 0 3 0  Afraid - awful might happen 0 0 3 0  Total GAD 7 Score 12 0 19 0  Anxiety Difficulty - - Very difficult -    BP Readings from Last 3 Encounters:  05/31/21 138/82  01/22/21 138/80  11/28/20 130/74    Physical Exam Vitals and nursing note reviewed.  Constitutional:      General: He is not in acute distress.    Appearance: Normal appearance. He is well-developed.  HENT:     Head: Normocephalic and atraumatic.  Cardiovascular:     Rate and Rhythm: Normal rate and regular rhythm.     Pulses: Normal pulses.     Heart sounds: No murmur heard. Pulmonary:     Effort: Pulmonary effort is normal. No respiratory distress.     Breath sounds: No wheezing or rhonchi.  Musculoskeletal:     Cervical back: Normal range of motion.     Right lower leg: No edema.     Left lower leg: No edema.  Lymphadenopathy:     Cervical: No cervical  adenopathy.  Skin:    General: Skin is warm and dry.     Capillary Refill: Capillary refill takes less than 2 seconds.     Findings: No rash.  Neurological:     General: No focal deficit present.     Mental Status: He is alert and oriented to person, place, and time.  Psychiatric:        Mood and Affect: Mood normal.        Behavior: Behavior normal.    Wt Readings from Last 3 Encounters:  05/31/21 245 lb (111.1 kg)  01/22/21 243  lb (110.2 kg)  11/28/20 245 lb (111.1 kg)    BP 138/82   Pulse 60   Temp 97.7 F (36.5 C) (Oral)   Ht 5\' 10"  (1.778 m)   Wt 245 lb (111.1 kg)   SpO2 98%   BMI 35.15 kg/m   Assessment and Plan: 1. Essential hypertension Clinically stable exam with well controlled BP. Tolerating medications without side effects at this time.  LE edema resolved with dose adjustment of amlodipine. Pt to continue current regimen and low sodium diet; benefits of regular exercise as able discussed.  2. Generalized anxiety disorder GAD is improved with Cymbalta. He feels well, has good energy and denies SI/HI. Will continue current therapy.   Partially dictated using . Any errors are unintentional.  Animal nutritionist, MD [facility]  05/31/2021

## 2021-06-26 DIAGNOSIS — D241 Benign neoplasm of right breast: Secondary | ICD-10-CM | POA: Diagnosis not present

## 2021-06-26 DIAGNOSIS — D242 Benign neoplasm of left breast: Secondary | ICD-10-CM | POA: Diagnosis not present

## 2021-07-10 ENCOUNTER — Encounter: Payer: Self-pay | Admitting: Internal Medicine

## 2021-08-06 DIAGNOSIS — M5416 Radiculopathy, lumbar region: Secondary | ICD-10-CM | POA: Diagnosis not present

## 2021-08-06 DIAGNOSIS — Z23 Encounter for immunization: Secondary | ICD-10-CM | POA: Diagnosis not present

## 2021-08-06 DIAGNOSIS — M79673 Pain in unspecified foot: Secondary | ICD-10-CM | POA: Diagnosis not present

## 2021-08-06 DIAGNOSIS — G894 Chronic pain syndrome: Secondary | ICD-10-CM | POA: Diagnosis not present

## 2021-08-06 DIAGNOSIS — M791 Myalgia, unspecified site: Secondary | ICD-10-CM | POA: Diagnosis not present

## 2021-08-07 ENCOUNTER — Encounter: Payer: Self-pay | Admitting: Internal Medicine

## 2021-08-12 ENCOUNTER — Other Ambulatory Visit: Payer: Self-pay | Admitting: Internal Medicine

## 2021-08-12 DIAGNOSIS — F411 Generalized anxiety disorder: Secondary | ICD-10-CM

## 2021-08-12 NOTE — Telephone Encounter (Signed)
Requested Prescriptions  Pending Prescriptions Disp Refills  . DULoxetine (CYMBALTA) 60 MG capsule [Pharmacy Med Name: DULOXETINE HCL DR 60 MG CAP] 30 capsule 5    Sig: TAKE 1 CAPSULE BY MOUTH EVERY DAY     Psychiatry: Antidepressants - SNRI Passed - 08/12/2021  1:25 AM      Passed - Last BP in normal range    BP Readings from Last 1 Encounters:  05/31/21 138/82         Passed - Valid encounter within last 6 months    Recent Outpatient Visits          2 months ago Essential hypertension   [facility] Reubin Milan, MD   6 months ago Generalized anxiety disorder   [facility] Reubin Milan, MD   8 months ago Annual physical exam   [facility] Reubin Milan, MD   1 year ago Essential hypertension   [facility] Reubin Milan, MD   1 year ago Cellulitis of left lower extremity   [facility] Reubin Milan, MD      Future Appointments            In 3 months Judithann Graves Nyoka Cowden, MD [facility], [facility]

## 2021-09-03 ENCOUNTER — Other Ambulatory Visit: Payer: Self-pay | Admitting: Internal Medicine

## 2021-09-03 DIAGNOSIS — F411 Generalized anxiety disorder: Secondary | ICD-10-CM

## 2021-09-03 NOTE — Telephone Encounter (Signed)
Rx written for 6 month supply- ok to change #90 1RF Requested Prescriptions  Pending Prescriptions Disp Refills  . DULoxetine (CYMBALTA) 60 MG capsule [Pharmacy Med Name: DULOXETINE HCL DR 60 MG CAP] 90 capsule 1    Sig: TAKE 1 CAPSULE BY MOUTH EVERY DAY     Psychiatry: Antidepressants - SNRI Passed - 09/03/2021 11:36 AM      Passed - Last BP in normal range    BP Readings from Last 1 Encounters:  05/31/21 138/82         Passed - Valid encounter within last 6 months    Recent Outpatient Visits          3 months ago Essential hypertension   [facility] Reubin Milan, MD   7 months ago Generalized anxiety disorder   [facility] Reubin Milan, MD   9 months ago Annual physical exam   [facility] Reubin Milan, MD   1 year ago Essential hypertension   [facility] Reubin Milan, MD   1 year ago Cellulitis of left lower extremity   [facility] Reubin Milan, MD      Future Appointments            In 3 months Judithann Graves Nyoka Cowden, MD [facility], [facility]

## 2021-09-11 DIAGNOSIS — M7541 Impingement syndrome of right shoulder: Secondary | ICD-10-CM | POA: Diagnosis not present

## 2021-09-13 DIAGNOSIS — M7541 Impingement syndrome of right shoulder: Secondary | ICD-10-CM | POA: Diagnosis not present

## 2021-09-18 DIAGNOSIS — M75121 Complete rotator cuff tear or rupture of right shoulder, not specified as traumatic: Secondary | ICD-10-CM | POA: Diagnosis not present

## 2021-09-19 DIAGNOSIS — M75120 Complete rotator cuff tear or rupture of unspecified shoulder, not specified as traumatic: Secondary | ICD-10-CM | POA: Insufficient documentation

## 2021-09-19 DIAGNOSIS — M75121 Complete rotator cuff tear or rupture of right shoulder, not specified as traumatic: Secondary | ICD-10-CM | POA: Diagnosis not present

## 2021-10-08 DIAGNOSIS — M75121 Complete rotator cuff tear or rupture of right shoulder, not specified as traumatic: Secondary | ICD-10-CM | POA: Diagnosis not present

## 2021-10-18 DIAGNOSIS — I350 Nonrheumatic aortic (valve) stenosis: Secondary | ICD-10-CM | POA: Diagnosis not present

## 2021-10-18 DIAGNOSIS — I25118 Atherosclerotic heart disease of native coronary artery with other forms of angina pectoris: Secondary | ICD-10-CM | POA: Diagnosis not present

## 2021-10-18 DIAGNOSIS — I10 Essential (primary) hypertension: Secondary | ICD-10-CM | POA: Diagnosis not present

## 2021-10-29 DIAGNOSIS — Z5181 Encounter for therapeutic drug level monitoring: Secondary | ICD-10-CM | POA: Diagnosis not present

## 2021-10-29 DIAGNOSIS — M791 Myalgia, unspecified site: Secondary | ICD-10-CM | POA: Diagnosis not present

## 2021-10-29 DIAGNOSIS — M75121 Complete rotator cuff tear or rupture of right shoulder, not specified as traumatic: Secondary | ICD-10-CM | POA: Diagnosis not present

## 2021-10-29 DIAGNOSIS — M5416 Radiculopathy, lumbar region: Secondary | ICD-10-CM | POA: Diagnosis not present

## 2021-10-29 DIAGNOSIS — G894 Chronic pain syndrome: Secondary | ICD-10-CM | POA: Diagnosis not present

## 2021-10-29 DIAGNOSIS — M25511 Pain in right shoulder: Secondary | ICD-10-CM | POA: Diagnosis not present

## 2021-10-29 DIAGNOSIS — Z79891 Long term (current) use of opiate analgesic: Secondary | ICD-10-CM | POA: Diagnosis not present

## 2021-10-29 DIAGNOSIS — M79673 Pain in unspecified foot: Secondary | ICD-10-CM | POA: Diagnosis not present

## 2021-11-02 DIAGNOSIS — M12811 Other specific arthropathies, not elsewhere classified, right shoulder: Secondary | ICD-10-CM | POA: Insufficient documentation

## 2021-11-02 DIAGNOSIS — M75101 Unspecified rotator cuff tear or rupture of right shoulder, not specified as traumatic: Secondary | ICD-10-CM | POA: Insufficient documentation

## 2021-11-04 DIAGNOSIS — M12811 Other specific arthropathies, not elsewhere classified, right shoulder: Secondary | ICD-10-CM | POA: Diagnosis not present

## 2021-11-04 DIAGNOSIS — M75101 Unspecified rotator cuff tear or rupture of right shoulder, not specified as traumatic: Secondary | ICD-10-CM | POA: Diagnosis not present

## 2021-11-21 DIAGNOSIS — D2230 Melanocytic nevi of unspecified part of face: Secondary | ICD-10-CM | POA: Diagnosis not present

## 2021-11-21 DIAGNOSIS — D225 Melanocytic nevi of trunk: Secondary | ICD-10-CM | POA: Diagnosis not present

## 2021-11-21 DIAGNOSIS — L821 Other seborrheic keratosis: Secondary | ICD-10-CM | POA: Diagnosis not present

## 2021-11-21 DIAGNOSIS — D224 Melanocytic nevi of scalp and neck: Secondary | ICD-10-CM | POA: Diagnosis not present

## 2021-11-21 DIAGNOSIS — L820 Inflamed seborrheic keratosis: Secondary | ICD-10-CM | POA: Diagnosis not present

## 2021-11-28 DIAGNOSIS — S0502XA Injury of conjunctiva and corneal abrasion without foreign body, left eye, initial encounter: Secondary | ICD-10-CM | POA: Diagnosis not present

## 2021-11-28 DIAGNOSIS — X58XXXA Exposure to other specified factors, initial encounter: Secondary | ICD-10-CM | POA: Diagnosis not present

## 2021-12-02 ENCOUNTER — Encounter: Payer: Self-pay | Admitting: Internal Medicine

## 2021-12-03 ENCOUNTER — Ambulatory Visit (INDEPENDENT_AMBULATORY_CARE_PROVIDER_SITE_OTHER): Payer: BC Managed Care – PPO | Admitting: Internal Medicine

## 2021-12-03 ENCOUNTER — Encounter: Payer: Self-pay | Admitting: Internal Medicine

## 2021-12-03 ENCOUNTER — Other Ambulatory Visit: Payer: Self-pay

## 2021-12-03 VITALS — BP 100/60 | HR 68 | Ht 70.0 in | Wt 240.0 lb

## 2021-12-03 DIAGNOSIS — K219 Gastro-esophageal reflux disease without esophagitis: Secondary | ICD-10-CM | POA: Diagnosis not present

## 2021-12-03 DIAGNOSIS — Z125 Encounter for screening for malignant neoplasm of prostate: Secondary | ICD-10-CM | POA: Diagnosis not present

## 2021-12-03 DIAGNOSIS — I10 Essential (primary) hypertension: Secondary | ICD-10-CM

## 2021-12-03 DIAGNOSIS — E559 Vitamin D deficiency, unspecified: Secondary | ICD-10-CM | POA: Diagnosis not present

## 2021-12-03 DIAGNOSIS — I712 Thoracic aortic aneurysm, without rupture, unspecified: Secondary | ICD-10-CM | POA: Insufficient documentation

## 2021-12-03 DIAGNOSIS — E782 Mixed hyperlipidemia: Secondary | ICD-10-CM | POA: Diagnosis not present

## 2021-12-03 DIAGNOSIS — Z0000 Encounter for general adult medical examination without abnormal findings: Secondary | ICD-10-CM

## 2021-12-03 DIAGNOSIS — N182 Chronic kidney disease, stage 2 (mild): Secondary | ICD-10-CM | POA: Diagnosis not present

## 2021-12-03 DIAGNOSIS — I25118 Atherosclerotic heart disease of native coronary artery with other forms of angina pectoris: Secondary | ICD-10-CM

## 2021-12-03 DIAGNOSIS — N62 Hypertrophy of breast: Secondary | ICD-10-CM

## 2021-12-03 DIAGNOSIS — G4733 Obstructive sleep apnea (adult) (pediatric): Secondary | ICD-10-CM

## 2021-12-03 DIAGNOSIS — Z9989 Dependence on other enabling machines and devices: Secondary | ICD-10-CM

## 2021-12-03 LAB — POCT URINALYSIS DIPSTICK
Bilirubin, UA: NEGATIVE
Blood, UA: NEGATIVE
Glucose, UA: NEGATIVE
Ketones, UA: NEGATIVE
Leukocytes, UA: NEGATIVE
Nitrite, UA: NEGATIVE
Protein, UA: NEGATIVE
Spec Grav, UA: 1.015 (ref 1.010–1.025)
Urobilinogen, UA: 0.2 E.U./dL
pH, UA: 6 (ref 5.0–8.0)

## 2021-12-03 NOTE — Progress Notes (Signed)
Date:  12/03/2021   Name:  Chad Choi   DOB:  10/07/1956   MRN:  [id number]   Chief Complaint: Annual Exam Chad Choi is a 66 y.o. male who presents today for his Complete Annual Exam. He feels well. He reports exercising walking at work. He reports he is sleeping well. He has a slight cough with a tickle in his throat but feels well.  Colonoscopy: 01/2020 repeat 7 yrs  Immunization History  Administered Date(s) Administered   Influenza-Unspecified 07/02/2017, 06/24/2019, 07/13/2020, 07/01/2021   PFIZER Comirnaty(Gray Top)Covid-19 Tri-Sucrose Vaccine 07/10/2020, 03/22/2021   PFIZER(Purple Top)SARS-COV-2 Vaccination 11/03/2019, 11/23/2019, 07/10/2020, 03/22/2021   Pfizer Covid-19 Vaccine Bivalent Booster 104yrs & up 08/06/2021   Pneumococcal Conjugate-13 06/04/2015   Pneumococcal Polysaccharide-23 02/04/2014, 10/27/2014, 05/07/2020   Td 05/07/2020   Tdap 09/07/2018   Zoster Recombinat (Shingrix) 12/17/2018, 12/22/2019    Lab Results  Component Value Date   PSA1 1.1 11/28/2020   PSA1 0.9 11/25/2019   PSA1 0.9 09/07/2018    Hypertension This is a chronic problem. The problem is controlled. Pertinent negatives include no chest pain, headaches, palpitations or shortness of breath. Past treatments include calcium channel blockers, diuretics and ACE inhibitors. There are no compliance problems.  Hypertensive end-organ damage includes kidney disease and CAD/MI. There is no history of CVA.  Hyperlipidemia This is a chronic problem. The problem is controlled. Pertinent negatives include no chest pain, myalgias or shortness of breath. Current antihyperlipidemic treatment includes statins. The current treatment provides significant improvement of lipids.  Depression        This is a chronic problem.The problem is unchanged.  Associated symptoms include no fatigue, no appetite change, no myalgias and no headaches.  Past treatments include SNRIs - Serotonin and norepinephrine reuptake  inhibitors.  Compliance with treatment is good.  Lab Results  Component Value Date   NA 141 11/28/2020   K 4.8 11/28/2020   CO2 24 11/28/2020   GLUCOSE 89 11/28/2020   BUN 17 11/28/2020   CREATININE 0.88 11/28/2020   CALCIUM 9.4 11/28/2020   GFRNONAA 90 11/28/2020   Lab Results  Component Value Date   CHOL 147 11/28/2020   HDL 61 11/28/2020   LDLCALC 69 11/28/2020   TRIG 88 11/28/2020   CHOLHDL 2.4 11/28/2020   Lab Results  Component Value Date   TSH 0.683 11/25/2019   No results found for: HGBA1C Lab Results  Component Value Date   WBC 12.7 (H) 11/28/2020   HGB 15.7 11/28/2020   HCT 46.4 11/28/2020   MCV 86 11/28/2020   PLT 304 11/28/2020   Lab Results  Component Value Date   ALT 30 11/28/2020   AST 19 11/28/2020   ALKPHOS 58 11/28/2020   BILITOT 1.0 11/28/2020   Lab Results  Component Value Date   VD25OH 33.1 11/25/2019     Review of Systems  Constitutional:  Negative for appetite change, chills, diaphoresis, fatigue and unexpected weight change.  HENT:  Negative for hearing loss, tinnitus, trouble swallowing and voice change.   Eyes:  Negative for visual disturbance.  Respiratory:  Positive for cough. Negative for choking, chest tightness, shortness of breath and wheezing.   Cardiovascular:  Negative for chest pain, palpitations and leg swelling.  Gastrointestinal:  Negative for abdominal pain, blood in stool, constipation and diarrhea.  Genitourinary:  Negative for difficulty urinating, dysuria and frequency.  Musculoskeletal:  Negative for arthralgias, back pain and myalgias.  Skin:  Negative for color change and rash.  Neurological:  Negative for  dizziness, syncope and headaches.  Hematological:  Negative for adenopathy.  Psychiatric/Behavioral:  Positive for depression. Negative for dysphoric mood and sleep disturbance. The patient is not nervous/anxious.    Patient Active Problem List   Diagnosis Date Noted   Thoracic aortic aneurysm without  rupture 12/03/2021   Right rotator cuff tear arthropathy 11/02/2021   Full thickness rotator cuff tear 09/19/2021   Generalized anxiety disorder 05/31/2021   Lateral epicondylitis of elbow 11/28/2020   Calculus of kidney 11/28/2020   Insomnia disorder 11/28/2020   Gastroesophageal reflux disease 11/28/2020   Chronic kidney disease, stage 2 (mild) 09/25/2020   Coronary artery disease of native artery of native heart with stable angina pectoris ([place]) 01/29/2020   Migraine without aura and without status migrainosus, not intractable 08/08/2019   Aortic stenosis 05/25/2019   Vitamin D deficiency 03/09/2019   History of atrial fibrillation 03/09/2019   History of arthroplasty of right ankle 07/05/2018   Degeneration of intervertebral disc of cervical region 04/20/2018   Hyperlipidemia 04/20/2018   Essential hypertension 04/20/2018   Hx of adenomatous colonic polyps 04/20/2018   OSA on CPAP 04/20/2018   Primary osteoarthritis of both hands 06/06/2016   Other age-related incipient cataract, bilateral 05/13/2016    Allergies  Allergen Reactions   Naproxen Hives   Sulfa Antibiotics Hives and Other (See Comments)    Past Surgical History:  Procedure Laterality Date   TOTAL ANKLE REPLACEMENT Right 03/2018    Social History   Tobacco Use   Smoking status: Former    Packs/day: 1.50    Years: 16.00    Pack years: 24.00    Types: Cigarettes    Quit date: 03/28/1987    Years since quitting: 34.7   Smokeless tobacco: Former    Quit date: 1989  Substance Use Topics   Alcohol use: Never   Drug use: Never     Medication list has been reviewed and updated.  Current Meds  Medication Sig   Acetaminophen 325 MG CAPS Take by mouth as needed.    amLODipine (NORVASC) 5 MG tablet TAKE 1 TABLET BY MOUTH EVERY DAY   ascorbic acid (VITAMIN C) 500 MG tablet TAKE ONE TABLET BY MOUTH ONCE EVERY DAY WITH IRON TABLETS AS DIRECTED   atorvastatin (LIPITOR) 40 MG tablet Take 40 mg by mouth daily.    Cholecalciferol 25 MCG (1000 UT) tablet Take 2 tablets by mouth daily.   cyanocobalamin 1000 MCG tablet Take 1 tablet by mouth daily.   cyclobenzaprine (FLEXERIL) 10 MG tablet TAKE 1 TABLET NIGHTLY AS NEEDED FOR MUSCLE SPAMS   diclofenac (VOLTAREN) 75 MG EC tablet Take 75 mg by mouth 2 (two) times daily.   diclofenac Sodium (VOLTAREN) 1 % GEL diclofenac 1 % topical gel  APPLY 2 GRAM TO THE AFFECTED AREA(S) BY TOPICAL ROUTE 4 TIMES PER DAY   DULoxetine (CYMBALTA) 60 MG capsule TAKE 1 CAPSULE BY MOUTH EVERY DAY   gabapentin (NEURONTIN) 300 MG capsule Take 300 mg by mouth at bedtime.   ibuprofen (ADVIL) 600 MG tablet Take 300 mg by mouth.   lisinopril (ZESTRIL) 40 MG tablet Use as directed 40 mg in the mouth or throat daily.   neomycin-polymyxin b-dexamethasone (MAXITROL) 3.5-10000-0.1 SUSP Place 1 drop into the left eye 3 (three) times daily.   NON FORMULARY CPAP nightly   omeprazole (PRILOSEC) 20 MG capsule TAKE ONE CAPSULE BY MOUTH TWO TIMES A DAY TO CONTROL STOMACH ACID.  TAKE 30 MINUTES BEFORE A MEAL   oxyCODONE (OXY IR/ROXICODONE) 5  MG immediate release tablet Take 5 mg by mouth every 6 (six) hours.   spironolactone (ALDACTONE) 25 MG tablet Take 25 mg by mouth daily.    PHQ 2/9 Scores 12/03/2021 05/31/2021 05/31/2021 01/22/2021  PHQ - 2 Score 0 5 0 2  PHQ- 9 Score 3 15 2 9     GAD 7 : Generalized Anxiety Score 12/03/2021 05/31/2021 05/31/2021 01/22/2021  Nervous, Anxious, on Edge 0 2 0 3  Control/stop worrying 0 2 0 3  Worry too much - different things 0 2 0 3  Trouble relaxing 0 3 0 2  Restless 0 3 0 2  Easily annoyed or irritable 0 0 0 3  Afraid - awful might happen 0 0 0 3  Total GAD 7 Score 0 12 0 19  Anxiety Difficulty - - Not difficult at all Very difficult    BP Readings from Last 3 Encounters:  12/03/21 100/60  05/31/21 138/82  01/22/21 138/80    Physical Exam Vitals and nursing note reviewed.  Constitutional:      Appearance: Normal appearance. He is well-developed.   HENT:     Head: Normocephalic.     Right Ear: Tympanic membrane, ear canal and external ear normal.     Left Ear: Tympanic membrane, ear canal and external ear normal.     Nose: Nose normal.  Eyes:     Conjunctiva/sclera: Conjunctivae normal.     Pupils: Pupils are equal, round, and reactive to light.  Neck:     Thyroid: No thyromegaly.     Vascular: No carotid bruit.  Cardiovascular:     Rate and Rhythm: Normal rate and regular rhythm.     Pulses: Normal pulses.     Heart sounds: Normal heart sounds.  Pulmonary:     Effort: Pulmonary effort is normal.     Breath sounds: Normal breath sounds. No wheezing.  Chest:  Breasts:    Right: No mass.     Left: No mass.     Comments: Mild bilateral gynecomastia Abdominal:     General: Bowel sounds are normal.     Palpations: Abdomen is soft.     Tenderness: There is no abdominal tenderness.  Musculoskeletal:        General: Normal range of motion.     Cervical back: Normal range of motion and neck supple.     Right lower leg: No edema.     Left lower leg: No edema.  Lymphadenopathy:     Cervical: No cervical adenopathy.  Skin:    General: Skin is warm and dry.     Capillary Refill: Capillary refill takes less than 2 seconds.  Neurological:     General: No focal deficit present.     Mental Status: He is alert and oriented to person, place, and time.     Deep Tendon Reflexes: Reflexes are normal and symmetric.  Psychiatric:        Attention and Perception: Attention normal.        Mood and Affect: Mood normal.    Wt Readings from Last 3 Encounters:  12/03/21 240 lb (108.9 kg)  05/31/21 245 lb (111.1 kg)  01/22/21 243 lb (110.2 kg)    BP 100/60    Pulse 68    Ht 5\' 10"  (1.778 m)    Wt 240 lb (108.9 kg)    SpO2 96%    BMI 34.44 kg/m   Assessment and Plan: 1. Annual physical exam Exam is normal except for weight. Encourage regular exercise  and appropriate dietary changes. Continue healthy diet, regular physical  activity Up to date on screenings and immunizations. - Hemoglobin A1c  2. Prostate cancer screening DRE deferred - PSA  3. Essential hypertension Clinically stable exam with well controlled BP. Tolerating medications without side effects at this time. Pt to continue current regimen and low sodium diet; benefits of regular exercise as able discussed. - TSH - POCT urinalysis dipstick  4. Mixed hyperlipidemia On statin and aspirin - Lipid panel  5. Chronic kidney disease, stage 2 (mild) Check labs; avoid daily use of NSAIDS - Comprehensive metabolic panel  6. OSA on CPAP Doing well with restful sleep and no daytime somnolence or headache  7. Gastroesophageal reflux disease, unspecified whether esophagitis present Symptoms well controlled on daily PPI No red flag signs such as weight loss, n/v, melena Will continue omeprazole bid. - CBC with Differential/Platelet  8. Vitamin D deficiency Continue supplementation - VITAMIN D 25 Hydroxy (Vit-D Deficiency, Fractures)  9. Coronary artery disease of native artery of native heart with stable angina pectoris ([place]) Followed by Cardiology. Recent visit - ECHO ordered for later this year to evaluate AS  10. Gynecomastia, male Noted by [facility] Mammogram benign. Spironolactone may be contributing.   Partially dictated using Editor, commissioning. Any errors are unintentional.  Halina Maidens, MD [facility]  12/03/2021

## 2021-12-04 ENCOUNTER — Encounter: Payer: Self-pay | Admitting: Internal Medicine

## 2021-12-04 DIAGNOSIS — R7303 Prediabetes: Secondary | ICD-10-CM | POA: Insufficient documentation

## 2021-12-04 LAB — COMPREHENSIVE METABOLIC PANEL
ALT: 32 IU/L (ref 0–44)
AST: 24 IU/L (ref 0–40)
Albumin/Globulin Ratio: 1.5 (ref 1.2–2.2)
Albumin: 3.9 g/dL (ref 3.8–4.8)
Alkaline Phosphatase: 66 IU/L (ref 44–121)
BUN/Creatinine Ratio: 17 (ref 10–24)
BUN: 21 mg/dL (ref 8–27)
Bilirubin Total: 0.3 mg/dL (ref 0.0–1.2)
CO2: 21 mmol/L (ref 20–29)
Calcium: 8.9 mg/dL (ref 8.6–10.2)
Chloride: 98 mmol/L (ref 96–106)
Creatinine, Ser: 1.24 mg/dL (ref 0.76–1.27)
Globulin, Total: 2.6 g/dL (ref 1.5–4.5)
Glucose: 95 mg/dL (ref 70–99)
Potassium: 4.6 mmol/L (ref 3.5–5.2)
Sodium: 135 mmol/L (ref 134–144)
Total Protein: 6.5 g/dL (ref 6.0–8.5)
eGFR: 64 mL/min/{1.73_m2} (ref >59)

## 2021-12-04 LAB — CBC WITH DIFFERENTIAL/PLATELET
Basophils Absolute: 0 10*3/uL (ref 0.0–0.2)
Basos: 1 %
EOS (ABSOLUTE): 0 10*3/uL (ref 0.0–0.4)
Eos: 1 %
Hematocrit: 37.9 % (ref 37.5–51.0)
Hemoglobin: 12.6 g/dL — ABNORMAL LOW (ref 13.0–17.7)
Immature Grans (Abs): 0 10*3/uL (ref 0.0–0.1)
Immature Granulocytes: 0 %
Lymphocytes Absolute: 1.2 10*3/uL (ref 0.7–3.1)
Lymphs: 18 %
MCH: 28.4 pg (ref 26.6–33.0)
MCHC: 33.2 g/dL (ref 31.5–35.7)
MCV: 85 fL (ref 79–97)
Monocytes Absolute: 1.4 10*3/uL — ABNORMAL HIGH (ref 0.1–0.9)
Monocytes: 22 %
Neutrophils Absolute: 3.8 10*3/uL (ref 1.4–7.0)
Neutrophils: 58 %
Platelets: 353 10*3/uL (ref 150–450)
RBC: 4.44 x10E6/uL (ref 4.14–5.80)
RDW: 12.1 % (ref 11.6–15.4)
WBC: 6.5 10*3/uL (ref 3.4–10.8)

## 2021-12-04 LAB — VITAMIN D 25 HYDROXY (VIT D DEFICIENCY, FRACTURES): Vit D, 25-Hydroxy: 38.7 ng/mL (ref 30.0–100.0)

## 2021-12-04 LAB — HEMOGLOBIN A1C
Est. average glucose Bld gHb Est-mCnc: 123 mg/dL
Hgb A1c MFr Bld: 5.9 % — ABNORMAL HIGH (ref 4.8–5.6)

## 2021-12-04 LAB — LIPID PANEL
Chol/HDL Ratio: 3.5 ratio (ref 0.0–5.0)
Cholesterol, Total: 126 mg/dL (ref 100–199)
HDL: 36 mg/dL — ABNORMAL LOW (ref >39)
LDL Chol Calc (NIH): 65 mg/dL (ref 0–99)
Triglycerides: 146 mg/dL (ref 0–149)
VLDL Cholesterol Cal: 25 mg/dL (ref 5–40)

## 2021-12-04 LAB — PSA: Prostate Specific Ag, Serum: 1.4 ng/mL (ref 0.0–4.0)

## 2021-12-04 LAB — TSH: TSH: 0.687 u[IU]/mL (ref 0.450–4.500)

## 2021-12-04 NOTE — Progress Notes (Signed)
Called pt with labs per Dr. Judithann Graves. Pt verbalized understanding.  KP

## 2021-12-27 DIAGNOSIS — M75101 Unspecified rotator cuff tear or rupture of right shoulder, not specified as traumatic: Secondary | ICD-10-CM | POA: Diagnosis not present

## 2021-12-27 DIAGNOSIS — I25118 Atherosclerotic heart disease of native coronary artery with other forms of angina pectoris: Secondary | ICD-10-CM | POA: Diagnosis not present

## 2021-12-27 DIAGNOSIS — M12811 Other specific arthropathies, not elsewhere classified, right shoulder: Secondary | ICD-10-CM | POA: Diagnosis not present

## 2022-01-24 DIAGNOSIS — M791 Myalgia, unspecified site: Secondary | ICD-10-CM | POA: Diagnosis not present

## 2022-01-24 DIAGNOSIS — M5416 Radiculopathy, lumbar region: Secondary | ICD-10-CM | POA: Diagnosis not present

## 2022-01-24 DIAGNOSIS — M79673 Pain in unspecified foot: Secondary | ICD-10-CM | POA: Diagnosis not present

## 2022-01-24 DIAGNOSIS — G894 Chronic pain syndrome: Secondary | ICD-10-CM | POA: Diagnosis not present

## 2022-02-17 DIAGNOSIS — I350 Nonrheumatic aortic (valve) stenosis: Secondary | ICD-10-CM | POA: Diagnosis not present

## 2022-02-17 DIAGNOSIS — I25118 Atherosclerotic heart disease of native coronary artery with other forms of angina pectoris: Secondary | ICD-10-CM | POA: Diagnosis not present

## 2022-02-21 DIAGNOSIS — G894 Chronic pain syndrome: Secondary | ICD-10-CM | POA: Diagnosis not present

## 2022-02-21 DIAGNOSIS — M79673 Pain in unspecified foot: Secondary | ICD-10-CM | POA: Diagnosis not present

## 2022-02-21 DIAGNOSIS — M5416 Radiculopathy, lumbar region: Secondary | ICD-10-CM | POA: Diagnosis not present

## 2022-02-21 DIAGNOSIS — M791 Myalgia, unspecified site: Secondary | ICD-10-CM | POA: Diagnosis not present

## 2022-03-04 ENCOUNTER — Other Ambulatory Visit: Payer: Self-pay | Admitting: Internal Medicine

## 2022-03-04 DIAGNOSIS — F411 Generalized anxiety disorder: Secondary | ICD-10-CM

## 2022-03-05 NOTE — Telephone Encounter (Signed)
Requested Prescriptions  Pending Prescriptions Disp Refills  . DULoxetine (CYMBALTA) 60 MG capsule [Pharmacy Med Name: DULOXETINE HCL DR 60 MG CAP] 90 capsule 1    Sig: TAKE 1 CAPSULE BY MOUTH EVERY DAY     Psychiatry: Antidepressants - SNRI - duloxetine Passed - 03/04/2022  1:38 AM      Passed - Cr in normal range and within 360 days    Creatinine, Ser  Date Value Ref Range Status  12/03/2021 1.24 0.76 - 1.27 mg/dL Final         Passed - eGFR is 30 or above and within 360 days    GFR calc Af Amer  Date Value Ref Range Status  11/28/2020 103 >59 mL/min/1.73 Final    Comment:    **In accordance with recommendations from the NKF-ASN Task force,**   [facility] is in the process of updating its eGFR calculation to the   2021 CKD-EPI creatinine equation that estimates kidney function   without a race variable.    GFR calc non Af Amer  Date Value Ref Range Status  11/28/2020 90 >59 mL/min/1.73 Final   eGFR  Date Value Ref Range Status  12/03/2021 64 >59 mL/min/1.73 Final         Passed - Completed PHQ-2 or PHQ-9 in the last 360 days      Passed - Last BP in normal range    BP Readings from Last 1 Encounters:  12/03/21 100/60         Passed - Valid encounter within last 6 months    Recent Outpatient Visits          3 months ago Annual physical exam   [facility] Glean Hess, MD   9 months ago Essential hypertension   [facility] Glean Hess, MD   1 year ago Generalized anxiety disorder   [facility] Glean Hess, MD   1 year ago Annual physical exam   [facility] Glean Hess, MD   1 year ago Essential hypertension   [facility] Glean Hess, MD      Future Appointments            In 2 months Army Melia Jesse Sans, MD [facility], [place]   In 9 months Army Melia Jesse Sans, MD [facility], [facility]

## 2022-03-13 DIAGNOSIS — H2513 Age-related nuclear cataract, bilateral: Secondary | ICD-10-CM | POA: Diagnosis not present

## 2022-04-25 DIAGNOSIS — Z79899 Other long term (current) drug therapy: Secondary | ICD-10-CM | POA: Diagnosis not present

## 2022-04-25 DIAGNOSIS — M791 Myalgia, unspecified site: Secondary | ICD-10-CM | POA: Diagnosis not present

## 2022-04-25 DIAGNOSIS — G894 Chronic pain syndrome: Secondary | ICD-10-CM | POA: Diagnosis not present

## 2022-04-25 DIAGNOSIS — M79671 Pain in right foot: Secondary | ICD-10-CM | POA: Diagnosis not present

## 2022-05-22 ENCOUNTER — Telehealth: Payer: Self-pay

## 2022-05-22 ENCOUNTER — Encounter: Payer: Self-pay | Admitting: Internal Medicine

## 2022-05-22 ENCOUNTER — Ambulatory Visit: Payer: BC Managed Care – PPO | Admitting: Family Medicine

## 2022-05-22 NOTE — Telephone Encounter (Signed)
Called pt about his appointment scheduled for 05/22/22. Told pt that he would need to go to [facility] to be seen for his back pain. Pt stated that [facility] only seen him for his ankle and not his back. Told pt that [facility] is for back pain as well that he could go see them for a follow up. Pt questioned why he could not be seen and stated that [facility] only gives him pain medication that they have not done any xrays for him. Pt sounded unhappy when speaking. Pt stated he would see Dr. Judithann Graves at his appointment on 8/21. Pt asked me what my name was and I told him. Pt said he was going to call [facility] to report what has happened. I asked pt if I could help him with anything else. Pt stated no he didn't being that I couldn't help him with getting seen.  Delice Bison called pt again after viewing pts chart letting him know that he has been treated for his back pain at [facility] that he would ned to see them as well.  KP

## 2022-05-23 ENCOUNTER — Ambulatory Visit: Payer: BC Managed Care – PPO | Admitting: Family Medicine

## 2022-05-25 ENCOUNTER — Other Ambulatory Visit: Payer: Self-pay | Admitting: Internal Medicine

## 2022-05-25 DIAGNOSIS — F411 Generalized anxiety disorder: Secondary | ICD-10-CM

## 2022-05-26 NOTE — Telephone Encounter (Signed)
Requested Prescriptions  Pending Prescriptions Disp Refills  . DULoxetine (CYMBALTA) 60 MG capsule [Pharmacy Med Name: DULOXETINE HCL DR 60 MG CAP] 90 capsule 0    Sig: TAKE 1 CAPSULE BY MOUTH EVERY DAY     Psychiatry: Antidepressants - SNRI - duloxetine Passed - 05/25/2022  8:57 AM      Passed - Cr in normal range and within 360 days    Creatinine, Ser  Date Value Ref Range Status  12/03/2021 1.24 0.76 - 1.27 mg/dL Final         Passed - eGFR is 30 or above and within 360 days    GFR calc Af Amer  Date Value Ref Range Status  11/28/2020 103 >59 mL/min/1.73 Final    Comment:    **In accordance with recommendations from the NKF-ASN Task force,**   [facility] is in the process of updating its eGFR calculation to the   2021 CKD-EPI creatinine equation that estimates kidney function   without a race variable.    GFR calc non Af Amer  Date Value Ref Range Status  11/28/2020 90 >59 mL/min/1.73 Final   eGFR  Date Value Ref Range Status  12/03/2021 64 >59 mL/min/1.73 Final         Passed - Completed PHQ-2 or PHQ-9 in the last 360 days      Passed - Last BP in normal range    BP Readings from Last 1 Encounters:  12/03/21 100/60         Passed - Valid encounter within last 6 months    Recent Outpatient Visits          5 months ago Annual physical exam   [facility] Glean Hess, MD   12 months ago Essential hypertension   [facility] Glean Hess, MD   1 year ago Generalized anxiety disorder   [facility] Glean Hess, MD   1 year ago Annual physical exam   [facility] Glean Hess, MD   1 year ago Essential hypertension   [facility] Glean Hess, MD      Future Appointments            In 1 week Army Melia Jesse Sans, MD [facility], [place]   In 6 months Army Melia, Jesse Sans, MD [facility], [facility]

## 2022-06-02 ENCOUNTER — Ambulatory Visit: Payer: BC Managed Care – PPO | Admitting: Internal Medicine

## 2022-06-02 ENCOUNTER — Encounter: Payer: Self-pay | Admitting: Internal Medicine

## 2022-06-02 VITALS — BP 125/76 | HR 68 | Ht 70.0 in | Wt 240.0 lb

## 2022-06-02 DIAGNOSIS — I10 Essential (primary) hypertension: Secondary | ICD-10-CM | POA: Diagnosis not present

## 2022-06-02 DIAGNOSIS — M545 Low back pain, unspecified: Secondary | ICD-10-CM | POA: Diagnosis not present

## 2022-06-02 DIAGNOSIS — I7121 Aneurysm of the ascending aorta, without rupture: Secondary | ICD-10-CM | POA: Diagnosis not present

## 2022-06-02 DIAGNOSIS — F411 Generalized anxiety disorder: Secondary | ICD-10-CM

## 2022-06-02 MED ORDER — DULOXETINE HCL 30 MG PO CPEP: 30.0000 mg | ORAL_CAPSULE | Freq: Every day | ORAL | 0 refills | Status: DC

## 2022-06-02 NOTE — Progress Notes (Signed)
Date:  06/02/2022   Name:  Chad Choi   DOB:  Apr 29, 1956   MRN:  [id number]   Chief Complaint: Hypertension and Back Pain  Hypertension This is a chronic problem. Pertinent negatives include no chest pain, palpitations or shortness of breath. Past treatments include calcium channel blockers, ACE inhibitors and diuretics. The current treatment provides significant improvement.  Back Pain This is a recurrent problem. The problem is unchanged. The pain is present in the lumbar spine. The pain is moderate. Pertinent negatives include no chest pain or fever.  Depression        This is a chronic problem.  The onset quality is undetermined.   The problem has been waxing and waning since onset.  Associated symptoms include decreased concentration, insomnia, irritable, decreased interest and myalgias.  Associated symptoms include no fatigue and no suicidal ideas.  Past treatments include SNRIs - Serotonin and norepinephrine reuptake inhibitors.  Compliance with treatment is good. CAD - followed by Cardiology and the [facility].  He has a thoracic aneurysm and is having scans every year.  He not aware of any change.  Lab Results  Component Value Date   NA 135 12/03/2021   K 4.6 12/03/2021   CO2 21 12/03/2021   GLUCOSE 95 12/03/2021   BUN 21 12/03/2021   CREATININE 1.24 12/03/2021   CALCIUM 8.9 12/03/2021   EGFR 64 12/03/2021   GFRNONAA 90 11/28/2020   Lab Results  Component Value Date   CHOL 126 12/03/2021   HDL 36 (L) 12/03/2021   LDLCALC 65 12/03/2021   TRIG 146 12/03/2021   CHOLHDL 3.5 12/03/2021   Lab Results  Component Value Date   TSH 0.687 12/03/2021   Lab Results  Component Value Date   HGBA1C 5.9 (H) 12/03/2021   Lab Results  Component Value Date   WBC 6.5 12/03/2021   HGB 12.6 (L) 12/03/2021   HCT 37.9 12/03/2021   MCV 85 12/03/2021   PLT 353 12/03/2021   Lab Results  Component Value Date   ALT 32 12/03/2021   AST 24 12/03/2021   ALKPHOS 66 12/03/2021    BILITOT 0.3 12/03/2021   Lab Results  Component Value Date   VD25OH 38.7 12/03/2021     Review of Systems  Constitutional:  Negative for chills, fatigue, fever and unexpected weight change.  Respiratory:  Negative for chest tightness and shortness of breath.   Cardiovascular:  Negative for chest pain, palpitations and leg swelling.  Musculoskeletal:  Positive for arthralgias (shoulder pain), back pain and myalgias.  Psychiatric/Behavioral:  Positive for decreased concentration, depression and dysphoric mood. Negative for suicidal ideas. The patient is nervous/anxious and has insomnia.     Patient Active Problem List   Diagnosis Date Noted   Prediabetes 12/04/2021   Thoracic aortic aneurysm without rupture ([place]) 12/03/2021   Gynecomastia, male 12/03/2021   Right rotator cuff tear arthropathy 11/02/2021   Full thickness rotator cuff tear 09/19/2021   Generalized anxiety disorder 05/31/2021   Lateral epicondylitis of elbow 11/28/2020   Calculus of kidney 11/28/2020   Insomnia disorder 11/28/2020   Gastroesophageal reflux disease 11/28/2020   Chronic kidney disease, stage 2 (mild) 09/25/2020   Coronary artery disease of native artery of native heart with stable angina pectoris ([place]) 01/29/2020   Migraine without aura and without status migrainosus, not intractable 08/08/2019   Aortic stenosis 05/25/2019   Vitamin D deficiency 03/09/2019   History of atrial fibrillation 03/09/2019   History of arthroplasty of right ankle 07/05/2018  Degeneration of intervertebral disc of cervical region 04/20/2018   Hyperlipidemia 04/20/2018   Essential hypertension 04/20/2018   Hx of adenomatous colonic polyps 04/20/2018   OSA on CPAP 04/20/2018   Primary osteoarthritis of both hands 06/06/2016   Other age-related incipient cataract, bilateral 05/13/2016    Allergies  Allergen Reactions   Naproxen Hives   Sulfa Antibiotics Hives and Other (See Comments)    Past Surgical History:   Procedure Laterality Date   TOTAL ANKLE REPLACEMENT Right 03/2018    Social History   Tobacco Use   Smoking status: Former    Packs/day: 1.50    Years: 16.00    Total pack years: 24.00    Types: Cigarettes    Quit date: 03/28/1987    Years since quitting: 35.2   Smokeless tobacco: Former    Quit date: 1989  Substance Use Topics   Alcohol use: Never   Drug use: Never     Medication list has been reviewed and updated.  Current Meds  Medication Sig   Acetaminophen 325 MG CAPS Take by mouth as needed.    amLODipine (NORVASC) 5 MG tablet TAKE 1 TABLET BY MOUTH EVERY DAY   ascorbic acid (VITAMIN C) 500 MG tablet TAKE ONE TABLET BY MOUTH ONCE EVERY DAY WITH IRON TABLETS AS DIRECTED   atorvastatin (LIPITOR) 40 MG tablet Take 40 mg by mouth daily.   Cholecalciferol 25 MCG (1000 UT) tablet Take 2 tablets by mouth daily.   cyanocobalamin 1000 MCG tablet Take 1 tablet by mouth daily.   cyclobenzaprine (FLEXERIL) 10 MG tablet TAKE 1 TABLET NIGHTLY AS NEEDED FOR MUSCLE SPAMS   diclofenac (VOLTAREN) 75 MG EC tablet Take 75 mg by mouth 2 (two) times daily.   diclofenac Sodium (VOLTAREN) 1 % GEL diclofenac 1 % topical gel  APPLY 2 GRAM TO THE AFFECTED AREA(S) BY TOPICAL ROUTE 4 TIMES PER DAY   DULoxetine (CYMBALTA) 30 MG capsule Take 1 capsule (30 mg total) by mouth daily. With a 60 mg to equal 90 mg.   DULoxetine (CYMBALTA) 60 MG capsule TAKE 1 CAPSULE BY MOUTH EVERY DAY   gabapentin (NEURONTIN) 300 MG capsule Take 300 mg by mouth at bedtime.   ibuprofen (ADVIL) 600 MG tablet Take 300 mg by mouth.   lisinopril (ZESTRIL) 40 MG tablet Use as directed 40 mg in the mouth or throat daily.   neomycin-polymyxin b-dexamethasone (MAXITROL) 3.5-10000-0.1 SUSP Place 1 drop into the left eye 3 (three) times daily.   NON FORMULARY CPAP nightly   omeprazole (PRILOSEC) 20 MG capsule TAKE ONE CAPSULE BY MOUTH TWO TIMES A DAY TO CONTROL STOMACH ACID.  TAKE 30 MINUTES BEFORE A MEAL   oxyCODONE (OXY  IR/ROXICODONE) 5 MG immediate release tablet Take 5 mg by mouth every 6 (six) hours.   spironolactone (ALDACTONE) 25 MG tablet Take 25 mg by mouth daily.       06/02/2022    2:50 PM 12/03/2021   10:35 AM 05/31/2021    2:48 PM 05/31/2021    2:39 PM  GAD 7 : Generalized Anxiety Score  Nervous, Anxious, on Edge 0 0 2 0  Control/stop worrying 1 0 2 0  Worry too much - different things 3 0 2 0  Trouble relaxing 3 0 3 0  Restless 0 0 3 0  Easily annoyed or irritable 0 0 0 0  Afraid - awful might happen 0 0 0 0  Total GAD 7 Score 7 0 12 0  Anxiety Difficulty Somewhat difficult  Not difficult at all       06/02/2022    2:50 PM 12/03/2021   10:35 AM 05/31/2021    2:47 PM  Depression screen PHQ 2/9  Decreased Interest 2 0 3  Down, Depressed, Hopeless 0 0 2  PHQ - 2 Score 2 0 5  Altered sleeping 3 0 3  Tired, decreased energy _0 Change in appetite 0 0 1  Feeling bad or failure about yourself  0 0 0  Trouble concentrating 1 0 0  Moving slowly or fidgety/restless 0 0 3  Suicidal thoughts 0 0 0  PHQ-9 Score _1 Difficult doing work/chores Not difficult at all Not difficult at all Extremely dIfficult    BP Readings from Last 3 Encounters:  06/02/22 125/76  12/03/21 100/60  05/31/21 138/82    Physical Exam Vitals and nursing note reviewed.  Constitutional:      General: He is irritable. He is not in acute distress.    Appearance: He is well-developed. He is obese.  HENT:     Head: Normocephalic and atraumatic.  Neck:     Vascular: No carotid bruit.  Cardiovascular:     Rate and Rhythm: Normal rate and regular rhythm.     Pulses: Normal pulses.     Heart sounds: Murmur heard.  Pulmonary:     Effort: Pulmonary effort is normal. No respiratory distress.     Breath sounds: No wheezing or rhonchi.  Musculoskeletal:     Cervical back: Normal range of motion.     Lumbar back: Spasms, tenderness and bony tenderness present. Decreased range of motion.     Right lower leg:  No edema.     Left lower leg: No edema.  Lymphadenopathy:     Cervical: No cervical adenopathy.  Skin:    General: Skin is warm and dry.     Findings: No rash.  Neurological:     Mental Status: He is alert and oriented to person, place, and time.     Gait: Gait abnormal.  Psychiatric:        Attention and Perception: Attention normal.        Mood and Affect: Mood is depressed.        Behavior: Behavior normal.        Thought Content: Thought content does not include suicidal plan.     Wt Readings from Last 3 Encounters:  06/02/22 240 lb (108.9 kg)  12/03/21 240 lb (108.9 kg)  05/31/21 245 lb (111.1 kg)    BP 125/76 (BP Location: Left Arm, Cuff Size: Large)   Pulse 68   Ht _2  (1.778 m)   Wt 240 lb (108.9 kg)   SpO2 96%   BMI 34.44 kg/m   Assessment and Plan: 1. Essential hypertension Clinically stable exam with well controlled BP. Tolerating medications without side effects at this time. Pt to continue current regimen and low sodium diet; benefits of regular exercise as able discussed.  2. Generalized anxiety disorder Will increase Duloxetine to 90 mg daily. - DULoxetine (CYMBALTA) 30 MG capsule; Take 1 capsule (30 mg total) by mouth daily. With a 60 mg to equal 90 mg.  Dispense: 90 capsule; Refill: 0  3. Midline low back pain, unspecified chronicity, unspecified whether sciatica present Plans to start PT via [facility] MRI declined by insurance until PT complete [facility] has already discussed ESI  4. Aneurysm of ascending aorta without rupture [facility]) Being followed by Cardiology and [facility]  Partially dictated using Editor, commissioning. Any errors are unintentional.  Halina Maidens, MD [facility]  06/02/2022

## 2022-06-20 DIAGNOSIS — M791 Myalgia, unspecified site: Secondary | ICD-10-CM | POA: Diagnosis not present

## 2022-06-20 DIAGNOSIS — Z79899 Other long term (current) drug therapy: Secondary | ICD-10-CM | POA: Diagnosis not present

## 2022-06-20 DIAGNOSIS — G894 Chronic pain syndrome: Secondary | ICD-10-CM | POA: Diagnosis not present

## 2022-06-20 DIAGNOSIS — Z5181 Encounter for therapeutic drug level monitoring: Secondary | ICD-10-CM | POA: Diagnosis not present

## 2022-06-20 DIAGNOSIS — M5416 Radiculopathy, lumbar region: Secondary | ICD-10-CM | POA: Diagnosis not present

## 2022-07-04 ENCOUNTER — Encounter: Payer: Self-pay | Admitting: Internal Medicine

## 2022-07-07 ENCOUNTER — Encounter: Payer: Self-pay | Admitting: Internal Medicine

## 2022-07-09 DIAGNOSIS — K219 Gastro-esophageal reflux disease without esophagitis: Secondary | ICD-10-CM | POA: Diagnosis not present

## 2022-07-09 DIAGNOSIS — Z0000 Encounter for general adult medical examination without abnormal findings: Secondary | ICD-10-CM | POA: Diagnosis not present

## 2022-07-09 DIAGNOSIS — E611 Iron deficiency: Secondary | ICD-10-CM | POA: Diagnosis not present

## 2022-07-09 DIAGNOSIS — E785 Hyperlipidemia, unspecified: Secondary | ICD-10-CM | POA: Diagnosis not present

## 2022-07-09 DIAGNOSIS — I10 Essential (primary) hypertension: Secondary | ICD-10-CM | POA: Diagnosis not present

## 2022-07-09 DIAGNOSIS — R0789 Other chest pain: Secondary | ICD-10-CM | POA: Diagnosis not present

## 2022-07-09 DIAGNOSIS — Z95828 Presence of other vascular implants and grafts: Secondary | ICD-10-CM | POA: Diagnosis not present

## 2022-08-12 DIAGNOSIS — I350 Nonrheumatic aortic (valve) stenosis: Secondary | ICD-10-CM | POA: Diagnosis not present

## 2022-08-12 DIAGNOSIS — I25118 Atherosclerotic heart disease of native coronary artery with other forms of angina pectoris: Secondary | ICD-10-CM | POA: Diagnosis not present

## 2022-08-12 DIAGNOSIS — I10 Essential (primary) hypertension: Secondary | ICD-10-CM | POA: Diagnosis not present

## 2022-08-14 DIAGNOSIS — I350 Nonrheumatic aortic (valve) stenosis: Secondary | ICD-10-CM | POA: Diagnosis not present

## 2022-08-27 ENCOUNTER — Other Ambulatory Visit: Payer: Self-pay | Admitting: Internal Medicine

## 2022-08-27 DIAGNOSIS — F411 Generalized anxiety disorder: Secondary | ICD-10-CM

## 2022-08-27 NOTE — Telephone Encounter (Signed)
Requested Prescriptions  Pending Prescriptions Disp Refills   DULoxetine (CYMBALTA) 60 MG capsule [Pharmacy Med Name: DULOXETINE HCL DR 60 MG CAP] 90 capsule 0    Sig: TAKE 1 CAPSULE BY MOUTH EVERY DAY     Psychiatry: Antidepressants - SNRI - duloxetine Passed - 08/27/2022  1:36 AM      Passed - Cr in normal range and within 360 days    Creatinine, Ser  Date Value Ref Range Status  12/03/2021 1.24 0.76 - 1.27 mg/dL Final         Passed - eGFR is 30 or above and within 360 days    GFR calc Af Amer  Date Value Ref Range Status  11/28/2020 103 >59 mL/min/1.73 Final    Comment:    **In accordance with recommendations from the NKF-ASN Task force,**   [facility] is in the process of updating its eGFR calculation to the   2021 CKD-EPI creatinine equation that estimates kidney function   without a race variable.    GFR calc non Af Amer  Date Value Ref Range Status  11/28/2020 90 >59 mL/min/1.73 Final   eGFR  Date Value Ref Range Status  12/03/2021 64 >59 mL/min/1.73 Final         Passed - Completed PHQ-2 or PHQ-9 in the last 360 days      Passed - Last BP in normal range    BP Readings from Last 1 Encounters:  06/02/22 125/76         Passed - Valid encounter within last 6 months    Recent Outpatient Visits           2 months ago Essential hypertension   [place] [facility] at [facility], Jesse Sans, MD   8 months ago Annual physical exam   [place] [facility] at [facility], Jesse Sans, MD   1 year ago Essential hypertension   [place] [facility] at [facility], Jesse Sans, MD   1 year ago Generalized anxiety disorder   [place] [facility] at [facility], Jesse Sans, MD   1 year ago Annual physical exam   [facility] at [facility], Jesse Sans, MD       Future Appointments              In 3 months Army Melia Jesse Sans, MD [facility] at [facility]             DULoxetine (CYMBALTA) 30 MG capsule [Pharmacy Med Name: DULOXETINE HCL DR 30 MG CAP] 90 capsule 1    Sig: TAKE 1 CAPSULE (30 MG TOTAL) BY MOUTH DAILY. WITH A 60 MG TO EQUAL 90 MG.     Psychiatry: Antidepressants - SNRI - duloxetine Passed - 08/27/2022  1:36 AM      Passed - Cr in normal range and within 360 days    Creatinine, Ser  Date Value Ref Range Status  12/03/2021 1.24 0.76 - 1.27 mg/dL Final         Passed - eGFR is 30 or above and within 360 days    GFR calc Af Amer  Date Value Ref Range Status  11/28/2020 103 >59 mL/min/1.73 Final    Comment:    **In accordance with recommendations from the NKF-ASN Task force,**   [facility] is in the process of updating its  eGFR calculation to the   2021 CKD-EPI creatinine equation that estimates kidney function   without a race variable.    GFR calc non Af Amer  Date Value Ref Range Status  11/28/2020 90 >59 mL/min/1.73 Final   eGFR  Date Value Ref Range Status  12/03/2021 64 >59 mL/min/1.73 Final         Passed - Completed PHQ-2 or PHQ-9 in the last 360 days      Passed - Last BP in normal range    BP Readings from Last 1 Encounters:  06/02/22 125/76         Passed - Valid encounter within last 6 months    Recent Outpatient Visits           2 months ago Essential hypertension   [place] [facility] at [facility], Jesse Sans, MD   8 months ago Annual physical exam   [place] [facility] at [facility], Jesse Sans, MD   1 year ago Essential hypertension   [place] [facility] at [facility], Jesse Sans, MD   1 year ago Generalized anxiety disorder   [place] [facility] at [facility], Jesse Sans, MD   1 year ago Annual physical exam   [facility]  and [facility] at [facility], Jesse Sans, MD       Future Appointments             In 3 months Army Melia, Jesse Sans, MD [place] [facility] at [facility] [place] [facility]

## 2022-09-19 DIAGNOSIS — M5416 Radiculopathy, lumbar region: Secondary | ICD-10-CM | POA: Diagnosis not present

## 2022-09-19 DIAGNOSIS — M791 Myalgia, unspecified site: Secondary | ICD-10-CM | POA: Diagnosis not present

## 2022-09-19 DIAGNOSIS — M79671 Pain in right foot: Secondary | ICD-10-CM | POA: Diagnosis not present

## 2022-09-19 DIAGNOSIS — G894 Chronic pain syndrome: Secondary | ICD-10-CM | POA: Diagnosis not present

## 2022-09-29 DIAGNOSIS — B351 Tinea unguium: Secondary | ICD-10-CM | POA: Diagnosis not present

## 2022-09-29 DIAGNOSIS — L600 Ingrowing nail: Secondary | ICD-10-CM | POA: Diagnosis not present

## 2022-10-29 DIAGNOSIS — L600 Ingrowing nail: Secondary | ICD-10-CM | POA: Diagnosis not present

## 2022-12-04 ENCOUNTER — Encounter: Payer: Self-pay | Admitting: Internal Medicine

## 2022-12-04 ENCOUNTER — Ambulatory Visit (INDEPENDENT_AMBULATORY_CARE_PROVIDER_SITE_OTHER): Payer: BC Managed Care – PPO | Admitting: Internal Medicine

## 2022-12-04 VITALS — BP 128/74 | HR 74 | Ht 70.0 in | Wt 253.2 lb

## 2022-12-04 DIAGNOSIS — I10 Essential (primary) hypertension: Secondary | ICD-10-CM | POA: Diagnosis not present

## 2022-12-04 DIAGNOSIS — Z0000 Encounter for general adult medical examination without abnormal findings: Secondary | ICD-10-CM

## 2022-12-04 DIAGNOSIS — G4733 Obstructive sleep apnea (adult) (pediatric): Secondary | ICD-10-CM

## 2022-12-04 DIAGNOSIS — Z125 Encounter for screening for malignant neoplasm of prostate: Secondary | ICD-10-CM

## 2022-12-04 DIAGNOSIS — I7121 Aneurysm of the ascending aorta, without rupture: Secondary | ICD-10-CM

## 2022-12-04 DIAGNOSIS — E785 Hyperlipidemia, unspecified: Secondary | ICD-10-CM | POA: Diagnosis not present

## 2022-12-04 DIAGNOSIS — E782 Mixed hyperlipidemia: Secondary | ICD-10-CM

## 2022-12-04 DIAGNOSIS — R7303 Prediabetes: Secondary | ICD-10-CM | POA: Diagnosis not present

## 2022-12-04 DIAGNOSIS — I25118 Atherosclerotic heart disease of native coronary artery with other forms of angina pectoris: Secondary | ICD-10-CM

## 2022-12-04 DIAGNOSIS — J0100 Acute maxillary sinusitis, unspecified: Secondary | ICD-10-CM

## 2022-12-04 DIAGNOSIS — F411 Generalized anxiety disorder: Secondary | ICD-10-CM

## 2022-12-04 MED ORDER — AZITHROMYCIN 250 MG PO TABS: ORAL_TABLET | ORAL | 0 refills | Status: AC

## 2022-12-04 MED ORDER — DULOXETINE HCL 60 MG PO CPEP: ORAL_CAPSULE | ORAL | 1 refills | Status: AC

## 2022-12-04 NOTE — Assessment & Plan Note (Signed)
Clinically stable without s/s of hypoglycemia. Advised low carb diet and weight loss Lab Results  Component Value Date   HGBA1C 5.9 (H) 12/03/2021

## 2022-12-04 NOTE — Assessment & Plan Note (Addendum)
Clinically stable exam with well controlled BP on amlodipine, lisinopril and eplerenone. Tolerating medications without side effects. Pt to continue current regimen and low sodium diet.

## 2022-12-04 NOTE — Progress Notes (Signed)
Date:  12/04/2022   Name:  Chad Choi   DOB:  12/30/1955   MRN:  [id number]   Chief Complaint: Annual Exam Chad Choi is a 67 y.o. male who presents today for his Complete Annual Exam. He feels well. He reports exercising - none. He reports he is sleeping well.   Colonoscopy: 01/2020 repeat 7 yrs  Immunization History  Administered Date(s) Administered   Influenza-Unspecified 07/02/2017, 06/24/2019, 07/13/2020, 07/01/2021, 07/04/2022   PFIZER Comirnaty(Gray Top)Covid-19 Tri-Sucrose Vaccine 07/10/2020, 03/22/2021   PFIZER(Purple Top)SARS-COV-2 Vaccination 11/03/2019, 11/23/2019, 07/10/2020, 03/22/2021   PNEUMOCOCCAL CONJUGATE-20 12/05/2021   Pfizer Covid-19 Vaccine Bivalent Booster 75yr & up 08/06/2021   Pneumococcal Conjugate-13 06/04/2015   Pneumococcal Polysaccharide-23 02/04/2014, 10/27/2014, 05/07/2020   Td 05/07/2020   Td (Adult), 2 Lf Tetanus Toxid, Preservative Free 05/07/2020   Tdap 09/07/2018   Zoster Recombinat (Shingrix) 12/17/2018, 12/22/2019   Health Maintenance Due  Topic Date Due   COVID-19 Vaccine (8 - 2023-24 season) 06/13/2022    Lab Results  Component Value Date   PSA1 1.4 12/03/2021   PSA1 1.1 11/28/2020   PSA1 0.9 11/25/2019    Hypertension This is a chronic problem. The problem is controlled. Pertinent negatives include no chest pain, headaches, palpitations or shortness of breath. Past treatments include ACE inhibitors, calcium channel blockers and diuretics. The current treatment provides significant improvement. Hypertensive end-organ damage includes CAD/MI. There is no history of kidney disease or CVA.  Hyperlipidemia This is a chronic problem. The problem is controlled. Associated symptoms include myalgias. Pertinent negatives include no chest pain or shortness of breath. Current antihyperlipidemic treatment includes statins. The current treatment provides significant improvement of lipids.  Diabetes He presents for his follow-up  diabetic visit. Diabetes type: prediabetes. Pertinent negatives for hypoglycemia include no dizziness, headaches or nervousness/anxiousness. Pertinent negatives for diabetes include no chest pain and no fatigue. Pertinent negatives for diabetic complications include no CVA.  Sinus Problem This is a recurrent problem. The current episode started 1 to 4 weeks ago. The problem is unchanged. There has been no fever. Associated symptoms include sinus pressure. Pertinent negatives include no chills, diaphoresis, headaches or shortness of breath. (Yellow green sinus drainage)    Lab Results  Component Value Date   NA 135 12/03/2021   K 4.6 12/03/2021   CO2 21 12/03/2021   GLUCOSE 95 12/03/2021   BUN 21 12/03/2021   CREATININE 1.24 12/03/2021   CALCIUM 8.9 12/03/2021   EGFR 64 12/03/2021   GFRNONAA 90 11/28/2020   Lab Results  Component Value Date   CHOL 126 12/03/2021   HDL 36 (L) 12/03/2021   LDLCALC 65 12/03/2021   TRIG 146 12/03/2021   CHOLHDL 3.5 12/03/2021   Lab Results  Component Value Date   TSH 0.687 12/03/2021   Lab Results  Component Value Date   HGBA1C 5.9 (H) 12/03/2021   Lab Results  Component Value Date   WBC 6.5 12/03/2021   HGB 12.6 (L) 12/03/2021   HCT 37.9 12/03/2021   MCV 85 12/03/2021   PLT 353 12/03/2021   Lab Results  Component Value Date   ALT 32 12/03/2021   AST 24 12/03/2021   ALKPHOS 66 12/03/2021   BILITOT 0.3 12/03/2021   Lab Results  Component Value Date   VD25OH 38.7 12/03/2021     Review of Systems  Constitutional:  Negative for appetite change, chills, diaphoresis, fatigue and unexpected weight change.  HENT:  Positive for postnasal drip and sinus pressure. Negative for hearing loss, tinnitus, trouble  swallowing and voice change.   Eyes:  Negative for visual disturbance.  Respiratory:  Negative for choking, shortness of breath and wheezing.   Cardiovascular:  Negative for chest pain, palpitations and leg swelling.  Gastrointestinal:   Negative for abdominal pain, blood in stool, constipation and diarrhea.  Genitourinary:  Negative for difficulty urinating, dysuria and frequency.  Musculoskeletal:  Positive for arthralgias and myalgias. Negative for back pain.  Skin:  Negative for color change and rash.  Neurological:  Negative for dizziness, syncope and headaches.  Hematological:  Negative for adenopathy.  Psychiatric/Behavioral:  Negative for dysphoric mood and sleep disturbance. The patient is not nervous/anxious.     Patient Active Problem List   Diagnosis Date Noted   Prediabetes 12/04/2021   Thoracic aortic aneurysm without rupture ([place]) 12/03/2021   Gynecomastia, male 12/03/2021   Right rotator cuff tear arthropathy 11/02/2021   Full thickness rotator cuff tear 09/19/2021   Generalized anxiety disorder 05/31/2021   Lateral epicondylitis of elbow 11/28/2020   Calculus of kidney 11/28/2020   Insomnia disorder 11/28/2020   Gastroesophageal reflux disease 11/28/2020   Chronic kidney disease, stage 2 (mild) 09/25/2020   Coronary artery disease of native artery of native heart with stable angina pectoris ([place]) 01/29/2020   Migraine without aura and without status migrainosus, not intractable 08/08/2019   Aortic stenosis 05/25/2019   Vitamin D deficiency 03/09/2019   History of atrial fibrillation 03/09/2019   History of arthroplasty of right ankle 07/05/2018   Degeneration of intervertebral disc of cervical region 04/20/2018   Hyperlipidemia 04/20/2018   Essential hypertension 04/20/2018   Hx of adenomatous colonic polyps 04/20/2018   OSA on CPAP 04/20/2018   Primary osteoarthritis of both hands 06/06/2016   Other age-related incipient cataract, bilateral 05/13/2016    Allergies  Allergen Reactions   Naproxen Hives   Sulfa Antibiotics Hives and Other (See Comments)    Past Surgical History:  Procedure Laterality Date   TOTAL ANKLE REPLACEMENT Right 03/2018    Social History   Tobacco Use    Smoking status: Former    Packs/day: 1.50    Years: 16.00    Total pack years: 24.00    Types: Cigarettes    Quit date: 03/28/1987    Years since quitting: 35.7   Smokeless tobacco: Former    Quit date: 1989  Substance Use Topics   Alcohol use: Never   Drug use: Never     Medication list has been reviewed and updated.  Current Meds  Medication Sig   Acetaminophen 325 MG CAPS Take by mouth as needed.    amLODipine (NORVASC) 5 MG tablet TAKE 1 TABLET BY MOUTH EVERY DAY   ascorbic acid (VITAMIN C) 500 MG tablet TAKE ONE TABLET BY MOUTH ONCE EVERY DAY WITH IRON TABLETS AS DIRECTED   atorvastatin (LIPITOR) 40 MG tablet Take 40 mg by mouth daily.   azithromycin (ZITHROMAX Z-PAK) 250 MG tablet UAD   Cholecalciferol 25 MCG (1000 UT) tablet Take 2 tablets by mouth daily.   cyanocobalamin 1000 MCG tablet Take 1 tablet by mouth daily.   cyclobenzaprine (FLEXERIL) 10 MG tablet TAKE 1 TABLET NIGHTLY AS NEEDED FOR MUSCLE SPAMS   diclofenac (VOLTAREN) 75 MG EC tablet Take 75 mg by mouth 2 (two) times daily.   diclofenac Sodium (VOLTAREN) 1 % GEL diclofenac 1 % topical gel  APPLY 2 GRAM TO THE AFFECTED AREA(S) BY TOPICAL ROUTE 4 TIMES PER DAY   eplerenone (INSPRA) 25 MG tablet Take 25 mg by mouth daily.  gabapentin (NEURONTIN) 300 MG capsule Take 300 mg by mouth at bedtime.   lisinopril (ZESTRIL) 40 MG tablet Use as directed 40 mg in the mouth or throat daily.   neomycin-polymyxin b-dexamethasone (MAXITROL) 3.5-10000-0.1 SUSP Place 1 drop into the left eye 3 (three) times daily.   NON FORMULARY CPAP nightly   omeprazole (PRILOSEC) 20 MG capsule TAKE ONE CAPSULE BY MOUTH TWO TIMES A DAY TO CONTROL STOMACH ACID.  TAKE 30 MINUTES BEFORE A MEAL   oxyCODONE (OXY IR/ROXICODONE) 5 MG immediate release tablet Take 5 mg by mouth every 6 (six) hours.   [DISCONTINUED] DULoxetine (CYMBALTA) 60 MG capsule TAKE 1 CAPSULE BY MOUTH EVERY DAY   [DISCONTINUED] eplerenone (INSPRA) 25 MG tablet Take 1 tablet by  mouth daily.       12/04/2022    8:37 AM 06/02/2022    2:50 PM 12/03/2021   10:35 AM 05/31/2021    2:48 PM  GAD 7 : Generalized Anxiety Score  Nervous, Anxious, on Edge 0 0 0 2  Control/stop worrying 0 1 0 2  Worry too much - different things 0 3 0 2  Trouble relaxing 0 3 0 3  Restless 0 0 0 3  Easily annoyed or irritable 0 0 0 0  Afraid - awful might happen 0 0 0 0  Total GAD 7 Score 0 7 0 12  Anxiety Difficulty Not difficult at all Somewhat difficult         12/04/2022    8:37 AM 06/02/2022    2:50 PM 12/03/2021   10:35 AM  Depression screen PHQ 2/9  Decreased Interest 0 2 0  Down, Depressed, Hopeless 0 0 0  PHQ - 2 Score 0 2 0  Altered sleeping 0 3 0  Tired, decreased energy 1 2 3  $ Change in appetite 0 0 0  Feeling bad or failure about yourself  0 0 0  Trouble concentrating 0 1 0  Moving slowly or fidgety/restless 0 0 0  Suicidal thoughts 0 0 0  PHQ-9 Score 1 8 3  $ Difficult doing work/chores Not difficult at all Not difficult at all Not difficult at all    BP Readings from Last 3 Encounters:  12/04/22 128/74  06/02/22 125/76  12/03/21 100/60    Physical Exam Vitals and nursing note reviewed.  Constitutional:      Appearance: Normal appearance. He is well-developed.  HENT:     Head: Normocephalic.     Right Ear: Tympanic membrane, ear canal and external ear normal.     Left Ear: Tympanic membrane, ear canal and external ear normal.     Nose:     Right Sinus: Maxillary sinus tenderness present. No frontal sinus tenderness.     Left Sinus: Maxillary sinus tenderness present. No frontal sinus tenderness.  Eyes:     Conjunctiva/sclera: Conjunctivae normal.     Pupils: Pupils are equal, round, and reactive to light.  Neck:     Thyroid: No thyromegaly.     Vascular: No carotid bruit.  Cardiovascular:     Rate and Rhythm: Normal rate and regular rhythm.     Heart sounds: Normal heart sounds.  Pulmonary:     Effort: Pulmonary effort is normal.     Breath  sounds: Normal breath sounds. No wheezing.  Chest:  Breasts:    Right: No mass.     Left: No mass.  Abdominal:     General: Bowel sounds are normal.     Palpations: Abdomen is soft.  Tenderness: There is no abdominal tenderness.  Musculoskeletal:        General: Normal range of motion.     Cervical back: Normal range of motion and neck supple.  Lymphadenopathy:     Cervical: No cervical adenopathy.  Skin:    General: Skin is warm and dry.  Neurological:     Mental Status: He is alert and oriented to person, place, and time.     Deep Tendon Reflexes: Reflexes are normal and symmetric.  Psychiatric:        Attention and Perception: Attention normal.        Mood and Affect: Mood normal.        Thought Content: Thought content normal.     Wt Readings from Last 3 Encounters:  12/04/22 253 lb 3.2 oz (114.9 kg)  06/02/22 240 lb (108.9 kg)  12/03/21 240 lb (108.9 kg)    BP 128/74   Pulse 74   Ht 5' 10"$  (1.778 m)   Wt 253 lb 3.2 oz (114.9 kg)   SpO2 96%   BMI 36.33 kg/m   Assessment and Plan: Problem List Items Addressed This Visit       Cardiovascular and Mediastinum   Thoracic aortic aneurysm without rupture (HCC) (Chronic)   Relevant Medications   eplerenone (INSPRA) 25 MG tablet   Essential hypertension (Chronic)    Clinically stable exam with well controlled BP on amlodipine, lisinopril and eplerenone. Tolerating medications without side effects. Pt to continue current regimen and low sodium diet.       Relevant Medications   eplerenone (INSPRA) 25 MG tablet   Other Relevant Orders   CBC with Differential/Platelet   Urinalysis, Routine w reflex microscopic   Coronary artery disease of native artery of native heart with stable angina pectoris (HCC) (Chronic)    Followed by cardiology      Relevant Medications   eplerenone (INSPRA) 25 MG tablet   DULoxetine (CYMBALTA) 60 MG capsule     Respiratory   OSA on CPAP (Chronic)    Followed by  [facility] Compliant with CPAP nightly        Other   Prediabetes (Chronic)    Clinically stable without s/s of hypoglycemia. Advised low carb diet and weight loss Lab Results  Component Value Date   HGBA1C 5.9 (H) 12/03/2021        Relevant Orders   Hemoglobin A1c   Hyperlipidemia (Chronic)    Tolerating statin medications without concerns LDL is  Lab Results  Component Value Date   LDLCALC 65 12/03/2021  with a goal of < 55. Consider Repatha       Relevant Medications   eplerenone (INSPRA) 25 MG tablet   Other Relevant Orders   Comprehensive metabolic panel   Lipid panel   Generalized anxiety disorder (Chronic)    Clinically stable on current regimen with good control of symptoms, No SI or HI. No change in management at this time.  Cymbalta 60 mg,  He did not see benefit from higher dose of 90 mg.      Relevant Medications   DULoxetine (CYMBALTA) 60 MG capsule   Other Visit Diagnoses     Annual physical exam    -  Primary   Relevant Orders   CBC with Differential/Platelet   Comprehensive metabolic panel   Hemoglobin A1c   Lipid panel   PSA   Prostate cancer screening       Relevant Orders   PSA   Acute non-recurrent maxillary sinusitis  Relevant Medications   azithromycin (ZITHROMAX Z-PAK) 250 MG tablet        Partially dictated using Editor, commissioning. Any errors are unintentional.  Halina Maidens, MD [facility]  12/04/2022

## 2022-12-04 NOTE — Assessment & Plan Note (Signed)
Tolerating statin medications without concerns LDL is  Lab Results  Component Value Date   LDLCALC 65 12/03/2021   with a goal of < 55. Consider Repatha

## 2022-12-04 NOTE — Assessment & Plan Note (Signed)
Followed by cardiology 

## 2022-12-04 NOTE — Assessment & Plan Note (Signed)
Clinically stable on current regimen with good control of symptoms, No SI or HI. No change in management at this time.  Cymbalta 60 mg,  He did not see benefit from higher dose of 90 mg.

## 2022-12-04 NOTE — Assessment & Plan Note (Signed)
Followed by [facility] Compliant with CPAP nightly

## 2022-12-05 LAB — CBC WITH DIFFERENTIAL/PLATELET
Basophils Absolute: 0.1 10*3/uL (ref 0.0–0.2)
Basos: 1 %
EOS (ABSOLUTE): 0.4 10*3/uL (ref 0.0–0.4)
Eos: 5 %
Hematocrit: 41.4 % (ref 37.5–51.0)
Hemoglobin: 13.6 g/dL (ref 13.0–17.7)
Immature Grans (Abs): 0 10*3/uL (ref 0.0–0.1)
Immature Granulocytes: 0 %
Lymphocytes Absolute: 1.3 10*3/uL (ref 0.7–3.1)
Lymphs: 19 %
MCH: 28.2 pg (ref 26.6–33.0)
MCHC: 32.9 g/dL (ref 31.5–35.7)
MCV: 86 fL (ref 79–97)
Monocytes Absolute: 1 10*3/uL — ABNORMAL HIGH (ref 0.1–0.9)
Monocytes: 15 %
Neutrophils Absolute: 4.2 10*3/uL (ref 1.4–7.0)
Neutrophils: 60 %
Platelets: 277 10*3/uL (ref 150–450)
RBC: 4.83 x10E6/uL (ref 4.14–5.80)
RDW: 13 % (ref 11.6–15.4)
WBC: 7 10*3/uL (ref 3.4–10.8)

## 2022-12-05 LAB — PSA: Prostate Specific Ag, Serum: 1.4 ng/mL (ref 0.0–4.0)

## 2022-12-05 LAB — COMPREHENSIVE METABOLIC PANEL
ALT: 32 IU/L (ref 0–44)
AST: 24 IU/L (ref 0–40)
Albumin/Globulin Ratio: 1.7 (ref 1.2–2.2)
Albumin: 4.2 g/dL (ref 3.9–4.9)
Alkaline Phosphatase: 90 IU/L (ref 44–121)
BUN/Creatinine Ratio: 17 (ref 10–24)
BUN: 19 mg/dL (ref 8–27)
Bilirubin Total: 0.8 mg/dL (ref 0.0–1.2)
CO2: 24 mmol/L (ref 20–29)
Calcium: 9.5 mg/dL (ref 8.6–10.2)
Chloride: 97 mmol/L (ref 96–106)
Creatinine, Ser: 1.09 mg/dL (ref 0.76–1.27)
Globulin, Total: 2.5 g/dL (ref 1.5–4.5)
Glucose: 87 mg/dL (ref 70–99)
Potassium: 5.1 mmol/L (ref 3.5–5.2)
Sodium: 140 mmol/L (ref 134–144)
Total Protein: 6.7 g/dL (ref 6.0–8.5)
eGFR: 74 mL/min/{1.73_m2} (ref >59)

## 2022-12-05 LAB — URINALYSIS, ROUTINE W REFLEX MICROSCOPIC
Bilirubin, UA: NEGATIVE
Glucose, UA: NEGATIVE
Ketones, UA: NEGATIVE
Leukocytes,UA: NEGATIVE
Nitrite, UA: NEGATIVE
RBC, UA: NEGATIVE
Specific Gravity, UA: 1.016 (ref 1.005–1.030)
Urobilinogen, Ur: 0.2 mg/dL (ref 0.2–1.0)
pH, UA: 6.5 (ref 5.0–7.5)

## 2022-12-05 LAB — LIPID PANEL
Chol/HDL Ratio: 3.3 ratio (ref 0.0–5.0)
Cholesterol, Total: 140 mg/dL (ref 100–199)
HDL: 43 mg/dL (ref >39)
LDL Chol Calc (NIH): 56 mg/dL (ref 0–99)
Triglycerides: 263 mg/dL — ABNORMAL HIGH (ref 0–149)
VLDL Cholesterol Cal: 41 mg/dL — ABNORMAL HIGH (ref 5–40)

## 2022-12-05 LAB — HEMOGLOBIN A1C
Est. average glucose Bld gHb Est-mCnc: 120 mg/dL
Hgb A1c MFr Bld: 5.8 % — ABNORMAL HIGH (ref 4.8–5.6)

## 2022-12-09 ENCOUNTER — Encounter: Payer: Self-pay | Admitting: Internal Medicine

## 2022-12-19 DIAGNOSIS — Z79899 Other long term (current) drug therapy: Secondary | ICD-10-CM | POA: Diagnosis not present

## 2022-12-19 DIAGNOSIS — G894 Chronic pain syndrome: Secondary | ICD-10-CM | POA: Diagnosis not present

## 2022-12-19 DIAGNOSIS — M5416 Radiculopathy, lumbar region: Secondary | ICD-10-CM | POA: Diagnosis not present

## 2022-12-19 DIAGNOSIS — M7071 Other bursitis of hip, right hip: Secondary | ICD-10-CM | POA: Diagnosis not present

## 2022-12-24 DIAGNOSIS — B351 Tinea unguium: Secondary | ICD-10-CM | POA: Diagnosis not present

## 2023-01-21 DIAGNOSIS — D225 Melanocytic nevi of trunk: Secondary | ICD-10-CM | POA: Diagnosis not present

## 2023-01-21 DIAGNOSIS — D2230 Melanocytic nevi of unspecified part of face: Secondary | ICD-10-CM | POA: Diagnosis not present

## 2023-01-21 DIAGNOSIS — D224 Melanocytic nevi of scalp and neck: Secondary | ICD-10-CM | POA: Diagnosis not present

## 2023-01-21 DIAGNOSIS — D2260 Melanocytic nevi of unspecified upper limb, including shoulder: Secondary | ICD-10-CM | POA: Diagnosis not present

## 2023-01-21 DIAGNOSIS — L820 Inflamed seborrheic keratosis: Secondary | ICD-10-CM | POA: Diagnosis not present

## 2023-02-23 DIAGNOSIS — I350 Nonrheumatic aortic (valve) stenosis: Secondary | ICD-10-CM | POA: Diagnosis not present

## 2023-03-13 DIAGNOSIS — M5416 Radiculopathy, lumbar region: Secondary | ICD-10-CM | POA: Diagnosis not present

## 2023-03-13 DIAGNOSIS — M7071 Other bursitis of hip, right hip: Secondary | ICD-10-CM | POA: Diagnosis not present

## 2023-03-13 DIAGNOSIS — M479 Spondylosis, unspecified: Secondary | ICD-10-CM | POA: Diagnosis not present

## 2023-03-13 DIAGNOSIS — G894 Chronic pain syndrome: Secondary | ICD-10-CM | POA: Diagnosis not present

## 2023-04-29 ENCOUNTER — Encounter: Payer: Self-pay | Admitting: Internal Medicine

## 2023-05-20 ENCOUNTER — Encounter: Payer: Self-pay | Admitting: Internal Medicine

## 2023-05-20 ENCOUNTER — Ambulatory Visit: Payer: BC Managed Care – PPO | Admitting: Internal Medicine

## 2023-05-20 VITALS — BP 134/78 | HR 59 | Ht 70.0 in | Wt 243.0 lb

## 2023-05-20 DIAGNOSIS — F411 Generalized anxiety disorder: Secondary | ICD-10-CM

## 2023-05-20 DIAGNOSIS — Z6836 Body mass index (BMI) 36.0-36.9, adult: Secondary | ICD-10-CM | POA: Insufficient documentation

## 2023-05-20 DIAGNOSIS — I10 Essential (primary) hypertension: Secondary | ICD-10-CM | POA: Diagnosis not present

## 2023-05-20 DIAGNOSIS — E782 Mixed hyperlipidemia: Secondary | ICD-10-CM

## 2023-05-20 NOTE — Assessment & Plan Note (Addendum)
Clinically stable on current regimen with good control of symptoms, No SI or HI. His wife wants him to add Abilify but he feels that he is doing well.  No change in management at this time - Cymbalta 60 mg.

## 2023-05-20 NOTE — Assessment & Plan Note (Signed)
Consider Ozempic - discussed with patient He will try Nutrisystem again which has worked well in the past

## 2023-05-20 NOTE — Assessment & Plan Note (Signed)
Normal exam with stable BP on lisinopril, amlodipine and Inspra. No concerns or side effects to current medication. No change in regimen; continue low sodium diet.

## 2023-05-20 NOTE — Assessment & Plan Note (Signed)
LDL is  Lab Results  Component Value Date   LDLCALC 56 12/04/2022   Currently being treated with atorvastatin 40 mg with good compliance and no concerns.

## 2023-05-20 NOTE — Progress Notes (Signed)
Date:  05/20/2023   Name:  Chad Choi   DOB:  1955/12/08   MRN:  [id number]   Chief Complaint: Depression, Hypertension, and Weight Check  Hypertension This is a chronic problem. The problem is controlled. Pertinent negatives include no chest pain, headaches, palpitations or shortness of breath. Past treatments include calcium channel blockers and ACE inhibitors (and Inspra). Hypertensive end-organ damage includes CAD/MI. There is no history of kidney disease or CVA.  Depression        This is a chronic problem.The problem is unchanged.  Associated symptoms include fatigue and myalgias.  Associated symptoms include no headaches.  Past treatments include SNRIs - Serotonin and norepinephrine reuptake inhibitors.  Compliance with treatment is good.  Previous treatment provided significant relief. Diabetes He presents for his follow-up diabetic visit. Diabetes type: prediabetes. Pertinent negatives for hypoglycemia include no dizziness, headaches or nervousness/anxiousness. Associated symptoms include fatigue. Pertinent negatives for diabetes include no chest pain and no weakness. Pertinent negatives for diabetic complications include no CVA.  Obesity - he would consider trying a GLP-1 if approved.  It may be covered using obesity and CAD.  Lab Results  Component Value Date   NA 140 12/04/2022   K 5.1 12/04/2022   CO2 24 12/04/2022   GLUCOSE 87 12/04/2022   BUN 19 12/04/2022   CREATININE 1.09 12/04/2022   CALCIUM 9.5 12/04/2022   EGFR 74 12/04/2022   GFRNONAA 90 11/28/2020   Lab Results  Component Value Date   CHOL 140 12/04/2022   HDL 43 12/04/2022   LDLCALC 56 12/04/2022   TRIG 263 (H) 12/04/2022   CHOLHDL 3.3 12/04/2022   Lab Results  Component Value Date   TSH 0.687 12/03/2021   Lab Results  Component Value Date   HGBA1C 5.8 (H) 12/04/2022   Lab Results  Component Value Date   WBC 7.0 12/04/2022   HGB 13.6 12/04/2022   HCT 41.4 12/04/2022   MCV 86 12/04/2022    PLT 277 12/04/2022   Lab Results  Component Value Date   ALT 32 12/04/2022   AST 24 12/04/2022   ALKPHOS 90 12/04/2022   BILITOT 0.8 12/04/2022   Lab Results  Component Value Date   VD25OH 38.7 12/03/2021     Review of Systems  Constitutional:  Positive for fatigue. Negative for unexpected weight change.  Eyes:  Negative for visual disturbance.  Respiratory:  Negative for cough, chest tightness, shortness of breath and wheezing.   Cardiovascular:  Negative for chest pain, palpitations and leg swelling.  Gastrointestinal:  Negative for abdominal pain, constipation and diarrhea.  Musculoskeletal:  Positive for arthralgias, back pain and myalgias.  Neurological:  Negative for dizziness, weakness, light-headedness and headaches.  Psychiatric/Behavioral:  Positive for depression. Negative for dysphoric mood and sleep disturbance. The patient is not nervous/anxious.     Patient Active Problem List   Diagnosis Date Noted   BMI 36.0-36.9,adult 05/20/2023   Prediabetes 12/04/2021   Thoracic aortic aneurysm without rupture (HCC) 12/03/2021   Gynecomastia, male 12/03/2021   Right rotator cuff tear arthropathy 11/02/2021   Full thickness rotator cuff tear 09/19/2021   Generalized anxiety disorder 05/31/2021   Lateral epicondylitis of elbow 11/28/2020   Calculus of kidney 11/28/2020   Insomnia disorder 11/28/2020   Gastroesophageal reflux disease 11/28/2020   Chronic kidney disease, stage 2 (mild) 09/25/2020   Coronary artery disease of native artery of native heart with stable angina pectoris (HCC) 01/29/2020   Migraine without aura and without status migrainosus, not intractable  08/08/2019   Aortic stenosis 05/25/2019   Vitamin D deficiency 03/09/2019   History of atrial fibrillation 03/09/2019   History of arthroplasty of right ankle 07/05/2018   Degeneration of intervertebral disc of cervical region 04/20/2018   Mixed hyperlipidemia 04/20/2018   Essential hypertension 04/20/2018    Hx of adenomatous colonic polyps 04/20/2018   OSA on CPAP 04/20/2018   Primary osteoarthritis of both hands 06/06/2016   Other age-related incipient cataract, bilateral 05/13/2016    Allergies  Allergen Reactions   Naproxen Hives   Sulfa Antibiotics Hives and Other (See Comments)    Past Surgical History:  Procedure Laterality Date   TOTAL ANKLE REPLACEMENT Right 03/2018    Social History   Tobacco Use   Smoking status: Former    Current packs/day: 0.00    Average packs/day: 1.5 packs/day for 16.0 years (24.0 ttl pk-yrs)    Types: Cigarettes    Start date: 03/28/1971    Quit date: 03/28/1987    Years since quitting: 36.1   Smokeless tobacco: Former    Quit date: 1989  Substance Use Topics   Alcohol use: Never   Drug use: Never     Medication list has been reviewed and updated.  Current Meds  Medication Sig   amLODipine (NORVASC) 5 MG tablet TAKE 1 TABLET BY MOUTH EVERY DAY   atorvastatin (LIPITOR) 40 MG tablet Take 40 mg by mouth daily.   Cholecalciferol 25 MCG (1000 UT) tablet Take 2 tablets by mouth daily.   cyanocobalamin 1000 MCG tablet Take 1 tablet by mouth daily.   cyclobenzaprine (FLEXERIL) 10 MG tablet TAKE 1 TABLET NIGHTLY AS NEEDED FOR MUSCLE SPAMS   DULoxetine (CYMBALTA) 60 MG capsule TAKE 1 CAPSULE BY MOUTH EVERY DAY   eplerenone (INSPRA) 25 MG tablet Take 25 mg by mouth daily.   gabapentin (NEURONTIN) 300 MG capsule Take 300 mg by mouth at bedtime.   lisinopril (ZESTRIL) 40 MG tablet Use as directed 40 mg in the mouth or throat daily.   NON FORMULARY CPAP nightly   oxyCODONE (OXY IR/ROXICODONE) 5 MG immediate release tablet Take 5 mg by mouth every 6 (six) hours.       05/20/2023    8:09 AM 12/04/2022    8:37 AM 06/02/2022    2:50 PM 12/03/2021   10:35 AM  GAD 7 : Generalized Anxiety Score  Nervous, Anxious, on Edge 0 0 0 0  Control/stop worrying 0 0 1 0  Worry too much - different things 0 0 3 0  Trouble relaxing 0 0 3 0  Restless 0 0 0 0   Easily annoyed or irritable 0 0 0 0  Afraid - awful might happen 0 0 0 0  Total GAD 7 Score 0 0 7 0  Anxiety Difficulty Not difficult at all Not difficult at all Somewhat difficult        05/20/2023    8:09 AM 12/04/2022    8:37 AM 06/02/2022    2:50 PM  Depression screen PHQ 2/9  Decreased Interest 0 0 2  Down, Depressed, Hopeless 0 0 0  PHQ - 2 Score 0 0 2  Altered sleeping 0 0 3  Tired, decreased energy 3 1 2   Change in appetite 0 0 0  Feeling bad or failure about yourself  0 0 0  Trouble concentrating 0 0 1  Moving slowly or fidgety/restless 1 0 0  Suicidal thoughts 0 0 0  PHQ-9 Score 4 1 8   Difficult doing work/chores Not difficult  at all Not difficult at all Not difficult at all    BP Readings from Last 3 Encounters:  05/20/23 134/78  12/04/22 128/74  06/02/22 125/76    Physical Exam Vitals and nursing note reviewed.  Constitutional:      General: He is not in acute distress.    Appearance: He is well-developed.  HENT:     Head: Normocephalic and atraumatic.  Neck:     Vascular: No carotid bruit.  Cardiovascular:     Rate and Rhythm: Normal rate and regular rhythm.  Pulmonary:     Effort: Pulmonary effort is normal. No respiratory distress.     Breath sounds: No wheezing or rhonchi.  Musculoskeletal:     Cervical back: Normal range of motion.     Right lower leg: No edema.     Left lower leg: No edema.  Lymphadenopathy:     Cervical: No cervical adenopathy.  Skin:    General: Skin is warm and dry.     Findings: No rash.  Neurological:     General: No focal deficit present.     Mental Status: He is alert and oriented to person, place, and time.  Psychiatric:        Mood and Affect: Mood normal.        Behavior: Behavior normal.     Wt Readings from Last 3 Encounters:  05/20/23 243 lb (110.2 kg)  12/04/22 253 lb 3.2 oz (114.9 kg)  06/02/22 240 lb (108.9 kg)    BP 134/78   Pulse (!) 59   Ht 5\' 10"  (1.778 m)   Wt 243 lb (110.2 kg)   SpO2 98%    BMI 34.87 kg/m   Assessment and Plan:  Problem List Items Addressed This Visit       Unprioritized   Mixed hyperlipidemia (Chronic)    LDL is  Lab Results  Component Value Date   LDLCALC 56 12/04/2022   Currently being treated with atorvastatin 40 mg with good compliance and no concerns.       Generalized anxiety disorder (Chronic)    Clinically stable on current regimen with good control of symptoms, No SI or HI. His wife wants him to add Abilify but he feels that he is doing well.  No change in management at this time - Cymbalta 60 mg.      Essential hypertension - Primary (Chronic)    Normal exam with stable BP on lisinopril, amlodipine and Inspra. No concerns or side effects to current medication. No change in regimen; continue low sodium diet.       BMI 36.0-36.9,adult    Consider Ozempic - discussed with patient He will try Nutrisystem again which has worked well in the past       Return in about 6 months (around 11/20/2023) for CPX.    Reubin Milan, MD [facility] [place]

## 2023-06-12 DIAGNOSIS — Z79899 Other long term (current) drug therapy: Secondary | ICD-10-CM | POA: Diagnosis not present

## 2023-06-12 DIAGNOSIS — M5416 Radiculopathy, lumbar region: Secondary | ICD-10-CM | POA: Diagnosis not present

## 2023-06-12 DIAGNOSIS — M7071 Other bursitis of hip, right hip: Secondary | ICD-10-CM | POA: Diagnosis not present

## 2023-06-12 DIAGNOSIS — G894 Chronic pain syndrome: Secondary | ICD-10-CM | POA: Diagnosis not present

## 2023-06-26 DIAGNOSIS — M47896 Other spondylosis, lumbar region: Secondary | ICD-10-CM | POA: Diagnosis not present

## 2023-06-26 DIAGNOSIS — M5136 Other intervertebral disc degeneration, lumbar region: Secondary | ICD-10-CM | POA: Diagnosis not present

## 2023-06-26 DIAGNOSIS — R0789 Other chest pain: Secondary | ICD-10-CM | POA: Diagnosis not present

## 2023-06-26 DIAGNOSIS — I10 Essential (primary) hypertension: Secondary | ICD-10-CM | POA: Diagnosis not present

## 2023-06-26 DIAGNOSIS — I350 Nonrheumatic aortic (valve) stenosis: Secondary | ICD-10-CM | POA: Diagnosis not present

## 2023-06-26 DIAGNOSIS — M545 Low back pain, unspecified: Secondary | ICD-10-CM | POA: Diagnosis not present

## 2023-06-29 ENCOUNTER — Encounter: Payer: Self-pay | Admitting: Internal Medicine

## 2023-07-02 DIAGNOSIS — H2513 Age-related nuclear cataract, bilateral: Secondary | ICD-10-CM | POA: Diagnosis not present

## 2023-08-13 DIAGNOSIS — I25118 Atherosclerotic heart disease of native coronary artery with other forms of angina pectoris: Secondary | ICD-10-CM | POA: Diagnosis not present

## 2023-08-13 DIAGNOSIS — I350 Nonrheumatic aortic (valve) stenosis: Secondary | ICD-10-CM | POA: Diagnosis not present

## 2023-08-13 DIAGNOSIS — I10 Essential (primary) hypertension: Secondary | ICD-10-CM | POA: Diagnosis not present

## 2023-09-04 DIAGNOSIS — M479 Spondylosis, unspecified: Secondary | ICD-10-CM | POA: Diagnosis not present

## 2023-09-04 DIAGNOSIS — Z79899 Other long term (current) drug therapy: Secondary | ICD-10-CM | POA: Diagnosis not present

## 2023-09-04 DIAGNOSIS — G894 Chronic pain syndrome: Secondary | ICD-10-CM | POA: Diagnosis not present

## 2023-09-04 DIAGNOSIS — M7071 Other bursitis of hip, right hip: Secondary | ICD-10-CM | POA: Diagnosis not present

## 2023-09-17 DIAGNOSIS — D225 Melanocytic nevi of trunk: Secondary | ICD-10-CM | POA: Diagnosis not present

## 2023-09-17 DIAGNOSIS — D2260 Melanocytic nevi of unspecified upper limb, including shoulder: Secondary | ICD-10-CM | POA: Diagnosis not present

## 2023-09-17 DIAGNOSIS — D224 Melanocytic nevi of scalp and neck: Secondary | ICD-10-CM | POA: Diagnosis not present

## 2023-09-17 DIAGNOSIS — D2230 Melanocytic nevi of unspecified part of face: Secondary | ICD-10-CM | POA: Diagnosis not present

## 2023-09-17 DIAGNOSIS — L820 Inflamed seborrheic keratosis: Secondary | ICD-10-CM | POA: Diagnosis not present

## 2023-11-10 DIAGNOSIS — M71571 Other bursitis, not elsewhere classified, right ankle and foot: Secondary | ICD-10-CM | POA: Diagnosis not present

## 2023-11-10 DIAGNOSIS — L600 Ingrowing nail: Secondary | ICD-10-CM | POA: Diagnosis not present

## 2023-11-26 ENCOUNTER — Encounter: Payer: Self-pay | Admitting: Internal Medicine

## 2023-11-26 ENCOUNTER — Ambulatory Visit (INDEPENDENT_AMBULATORY_CARE_PROVIDER_SITE_OTHER): Payer: BC Managed Care – PPO | Admitting: Internal Medicine

## 2023-11-26 VITALS — BP 102/66 | HR 64 | Ht 70.0 in | Wt 237.0 lb

## 2023-11-26 DIAGNOSIS — R7303 Prediabetes: Secondary | ICD-10-CM | POA: Diagnosis not present

## 2023-11-26 DIAGNOSIS — Z0000 Encounter for general adult medical examination without abnormal findings: Secondary | ICD-10-CM

## 2023-11-26 DIAGNOSIS — Z125 Encounter for screening for malignant neoplasm of prostate: Secondary | ICD-10-CM | POA: Diagnosis not present

## 2023-11-26 DIAGNOSIS — I25118 Atherosclerotic heart disease of native coronary artery with other forms of angina pectoris: Secondary | ICD-10-CM

## 2023-11-26 DIAGNOSIS — K219 Gastro-esophageal reflux disease without esophagitis: Secondary | ICD-10-CM | POA: Diagnosis not present

## 2023-11-26 DIAGNOSIS — I10 Essential (primary) hypertension: Secondary | ICD-10-CM | POA: Diagnosis not present

## 2023-11-26 DIAGNOSIS — M1712 Unilateral primary osteoarthritis, left knee: Secondary | ICD-10-CM

## 2023-11-26 NOTE — Assessment & Plan Note (Signed)
Continue brace while working Add ice at the end of the day; take tylenol as needed May need to see Ortho or SM if worsening

## 2023-11-26 NOTE — Assessment & Plan Note (Signed)
Reflux symptoms are minimal on current therapy - omeprazole. No red flag signs such as weight loss, n/v, melena

## 2023-11-26 NOTE — Assessment & Plan Note (Signed)
Managed with diet. Lab Results  Component Value Date   HGBA1C 5.8 (H) 12/04/2022

## 2023-11-26 NOTE — Assessment & Plan Note (Addendum)
Stable angina with no recent change. Followed by cardiology for CAD and aortic stenosis which is stable. On Inspra, atorvastatin Lab Results  Component Value Date   LDLCALC 56 12/04/2022

## 2023-11-26 NOTE — Assessment & Plan Note (Signed)
Controlled BP with normal exam. Current regimen is lisinopril, amlodipine and Inspra. Will continue same medications; encourage continued reduced sodium diet.

## 2023-11-26 NOTE — Progress Notes (Signed)
Date:  11/26/2023   Name:  Chad Choi   DOB:  07-21-1956   MRN:  [id number]   Chief Complaint: Annual Exam Chad Choi is a 68 y.o. male who presents today for his Complete Annual Exam. He feels well. He reports exercising walking at work. He reports he is sleeping well.  He gets all his medications from the [facility] except for pain medications. He continues to work full time and feels well.  Health Maintenance  Topic Date Due   COVID-19 Vaccine (10 - 2024-25 season) 12/12/2023*   Colon Cancer Screening  02/03/2027   DTaP/Tdap/Td vaccine (4 - Td or Tdap) 05/07/2030   Pneumonia Vaccine  Completed   Flu Shot  Completed   Hepatitis C Screening  Completed   Zoster (Shingles) Vaccine  Completed   HPV Vaccine  Aged Out  *Topic was postponed. The date shown is not the original due date.    Lab Results  Component Value Date   PSA1 1.4 12/04/2022   PSA1 1.4 12/03/2021   PSA1 1.1 11/28/2020    Hypertension This is a chronic problem. The problem is controlled. Pertinent negatives include no chest pain, headaches, palpitations or shortness of breath. Past treatments include ACE inhibitors, calcium channel blockers and diuretics. The current treatment provides significant improvement. Hypertensive end-organ damage includes CAD/MI. There is no history of kidney disease.  Hyperlipidemia This is a chronic problem. Pertinent negatives include no chest pain or shortness of breath. Current antihyperlipidemic treatment includes statins. The current treatment provides significant improvement of lipids.  Gastroesophageal Reflux He complains of heartburn. He reports no abdominal pain, no chest pain, no coughing or no wheezing. This is a recurrent problem. The problem occurs occasionally. Pertinent negatives include no fatigue. He has tried a PPI for the symptoms.    Review of Systems  Constitutional:  Negative for fatigue and unexpected weight change.  HENT:  Negative for nosebleeds.   Eyes:   Negative for visual disturbance.  Respiratory:  Negative for cough, chest tightness, shortness of breath and wheezing.   Cardiovascular:  Negative for chest pain, palpitations and leg swelling.  Gastrointestinal:  Positive for heartburn. Negative for abdominal pain, constipation and diarrhea.  Genitourinary:  Positive for frequency. Negative for dysuria and urgency.  Musculoskeletal:  Positive for arthralgias (left knee arthalgia).  Neurological:  Negative for dizziness, weakness, light-headedness and headaches.  Psychiatric/Behavioral:  Negative for dysphoric mood and sleep disturbance. The patient is not nervous/anxious.      Lab Results  Component Value Date   NA 140 12/04/2022   K 5.1 12/04/2022   CO2 24 12/04/2022   GLUCOSE 87 12/04/2022   BUN 19 12/04/2022   CREATININE 1.09 12/04/2022   CALCIUM 9.5 12/04/2022   EGFR 74 12/04/2022   GFRNONAA 90 11/28/2020   Lab Results  Component Value Date   CHOL 140 12/04/2022   HDL 43 12/04/2022   LDLCALC 56 12/04/2022   TRIG 263 (H) 12/04/2022   CHOLHDL 3.3 12/04/2022   Lab Results  Component Value Date   TSH 0.687 12/03/2021   Lab Results  Component Value Date   HGBA1C 5.8 (H) 12/04/2022   Lab Results  Component Value Date   WBC 7.0 12/04/2022   HGB 13.6 12/04/2022   HCT 41.4 12/04/2022   MCV 86 12/04/2022   PLT 277 12/04/2022   Lab Results  Component Value Date   ALT 32 12/04/2022   AST 24 12/04/2022   ALKPHOS 90 12/04/2022   BILITOT 0.8 12/04/2022  Lab Results  Component Value Date   VD25OH 38.7 12/03/2021     Patient Active Problem List   Diagnosis Date Noted   Primary osteoarthritis of left knee 11/26/2023   BMI 36.0-36.9,adult 05/20/2023   Prediabetes 12/04/2021   Thoracic aortic aneurysm without rupture (HCC) 12/03/2021   Gynecomastia, male 12/03/2021   Right rotator cuff tear arthropathy 11/02/2021   Full thickness rotator cuff tear 09/19/2021   Generalized anxiety disorder 05/31/2021   Lateral  epicondylitis of elbow 11/28/2020   Calculus of kidney 11/28/2020   Insomnia disorder 11/28/2020   Gastroesophageal reflux disease 11/28/2020   Coronary artery disease of native artery of native heart with stable angina pectoris (HCC) 01/29/2020   Migraine without aura and without status migrainosus, not intractable 08/08/2019   Aortic stenosis 05/25/2019   Vitamin D deficiency 03/09/2019   History of atrial fibrillation 03/09/2019   Obesity, morbid (HCC) 09/07/2018   History of arthroplasty of right ankle 07/05/2018   Degeneration of intervertebral disc of cervical region 04/20/2018   Mixed hyperlipidemia 04/20/2018   Essential hypertension 04/20/2018   Hx of adenomatous colonic polyps 04/20/2018   OSA on CPAP 04/20/2018   Primary osteoarthritis of both hands 06/06/2016   Other age-related incipient cataract, bilateral 05/13/2016    Allergies  Allergen Reactions   Naproxen Hives   Sulfa Antibiotics Hives and Other (See Comments)    Past Surgical History:  Procedure Laterality Date   TOTAL ANKLE REPLACEMENT Right 03/2018    Social History   Tobacco Use   Smoking status: Former    Current packs/day: 0.00    Average packs/day: 1.5 packs/day for 16.0 years (24.0 ttl pk-yrs)    Types: Cigarettes    Start date: 03/28/1971    Quit date: 03/28/1987    Years since quitting: 36.6   Smokeless tobacco: Former    Quit date: 1989  Substance Use Topics   Alcohol use: Never   Drug use: Never     Medication list has been reviewed and updated.  No outpatient medications have been marked as taking for the 11/26/23 encounter (Office Visit) with Reubin Milan, MD.       11/26/2023    8:08 AM 05/20/2023    8:09 AM 12/04/2022    8:37 AM 06/02/2022    2:50 PM  GAD 7 : Generalized Anxiety Score  Nervous, Anxious, on Edge 0 0 0 0  Control/stop worrying 0 0 0 1  Worry too much - different things 0 0 0 3  Trouble relaxing 0 0 0 3  Restless 0 0 0 0  Easily annoyed or irritable 0 0 0  0  Afraid - awful might happen 0 0 0 0  Total GAD 7 Score 0 0 0 7  Anxiety Difficulty Not difficult at all Not difficult at all Not difficult at all Somewhat difficult       11/26/2023    8:08 AM 05/20/2023    8:09 AM 12/04/2022    8:37 AM  Depression screen PHQ 2/9  Decreased Interest 0 0 0  Down, Depressed, Hopeless 0 0 0  PHQ - 2 Score 0 0 0  Altered sleeping  0 0  Tired, decreased energy  3 1  Change in appetite  0 0  Feeling bad or failure about yourself   0 0  Trouble concentrating  0 0  Moving slowly or fidgety/restless  1 0  Suicidal thoughts  0 0  PHQ-9 Score  4 1  Difficult doing work/chores  Not  difficult at all Not difficult at all    BP Readings from Last 3 Encounters:  11/26/23 102/66  05/20/23 134/78  12/04/22 128/74    Physical Exam Vitals and nursing note reviewed.  Constitutional:      Appearance: Normal appearance. He is well-developed.  HENT:     Head: Normocephalic.     Right Ear: Tympanic membrane, ear canal and external ear normal.     Left Ear: Tympanic membrane, ear canal and external ear normal.     Nose: Nose normal.  Eyes:     Conjunctiva/sclera: Conjunctivae normal.     Pupils: Pupils are equal, round, and reactive to light.  Neck:     Thyroid: No thyromegaly.     Vascular: No carotid bruit.  Cardiovascular:     Rate and Rhythm: Normal rate and regular rhythm.     Heart sounds: Normal heart sounds.  Pulmonary:     Effort: Pulmonary effort is normal.     Breath sounds: Normal breath sounds. No wheezing.  Chest:  Breasts:    Right: No mass.     Left: No mass.  Abdominal:     General: Bowel sounds are normal.     Palpations: Abdomen is soft.     Tenderness: There is no abdominal tenderness.  Musculoskeletal:        General: Normal range of motion.     Cervical back: Normal range of motion and neck supple.     Right knee: No swelling or erythema. Normal range of motion.     Left knee: No swelling or erythema. Normal range of  motion.  Lymphadenopathy:     Cervical: No cervical adenopathy.  Skin:    General: Skin is warm and dry.  Neurological:     General: No focal deficit present.     Mental Status: He is alert and oriented to person, place, and time.     Deep Tendon Reflexes: Reflexes are normal and symmetric.  Psychiatric:        Attention and Perception: Attention normal.        Mood and Affect: Mood normal.        Thought Content: Thought content normal.     Wt Readings from Last 3 Encounters:  11/26/23 237 lb (107.5 kg)  05/20/23 243 lb (110.2 kg)  12/04/22 253 lb 3.2 oz (114.9 kg)    BP 102/66   Pulse 64   Ht 5\' 10"  (1.778 m)   Wt 237 lb (107.5 kg)   SpO2 95%   BMI 34.01 kg/m   Assessment and Plan:  Problem List Items Addressed This Visit       Unprioritized   Essential hypertension (Chronic)   Controlled BP with normal exam. Current regimen is lisinopril, amlodipine and Inspra. Will continue same medications; encourage continued reduced sodium diet.       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 Followed by cardiology for CAD and aortic stenosis which is stable. On Inspra, atorvastatin Lab Results  Component Value Date   LDLCALC 56 12/04/2022         Relevant Orders   Lipid panel   Gastroesophageal reflux disease (Chronic)   Reflux symptoms are minimal on current therapy - omeprazole. No red flag signs such as weight loss, n/v, melena  Relevant Orders   CBC with Differential/Platelet   Prediabetes (Chronic)   Managed with diet. Lab Results  Component Value Date   HGBA1C 5.8 (H) 12/04/2022         Relevant Orders   Hemoglobin A1c   Obesity, morbid (HCC)   Primary osteoarthritis of left knee   Continue brace while working Add  ice at the end of the day; take tylenol as needed May need to see Ortho or SM if worsening      Other Visit Diagnoses       Annual physical exam    -  Primary     Prostate cancer screening       Relevant Orders   PSA       Return in about 6 months (around 05/25/2024) for HTN, Depression.    Reubin Milan, MD [facility] [place]

## 2023-11-27 ENCOUNTER — Encounter: Payer: Self-pay | Admitting: Internal Medicine

## 2023-11-27 LAB — URINALYSIS, ROUTINE W REFLEX MICROSCOPIC
Bilirubin, UA: NEGATIVE
Glucose, UA: NEGATIVE
Ketones, UA: NEGATIVE
Leukocytes,UA: NEGATIVE
Nitrite, UA: NEGATIVE
Protein,UA: NEGATIVE
RBC, UA: NEGATIVE
Specific Gravity, UA: 1.019 (ref 1.005–1.030)
Urobilinogen, Ur: 0.2 mg/dL (ref 0.2–1.0)
pH, UA: 6.5 (ref 5.0–7.5)

## 2023-11-27 LAB — CBC WITH DIFFERENTIAL/PLATELET
Basophils Absolute: 0.1 10*3/uL (ref 0.0–0.2)
Basos: 1 %
EOS (ABSOLUTE): 0.3 10*3/uL (ref 0.0–0.4)
Eos: 5 %
Hematocrit: 43.3 % (ref 37.5–51.0)
Hemoglobin: 14.5 g/dL (ref 13.0–17.7)
Immature Grans (Abs): 0 10*3/uL (ref 0.0–0.1)
Immature Granulocytes: 0 %
Lymphocytes Absolute: 1.4 10*3/uL (ref 0.7–3.1)
Lymphs: 22 %
MCH: 28.5 pg (ref 26.6–33.0)
MCHC: 33.5 g/dL (ref 31.5–35.7)
MCV: 85 fL (ref 79–97)
Monocytes Absolute: 0.7 10*3/uL (ref 0.1–0.9)
Monocytes: 11 %
Neutrophils Absolute: 3.9 10*3/uL (ref 1.4–7.0)
Neutrophils: 61 %
Platelets: 302 10*3/uL (ref 150–450)
RBC: 5.08 x10E6/uL (ref 4.14–5.80)
RDW: 13.1 % (ref 11.6–15.4)
WBC: 6.4 10*3/uL (ref 3.4–10.8)

## 2023-11-27 LAB — COMPREHENSIVE METABOLIC PANEL
ALT: 27 [IU]/L (ref 0–44)
AST: 23 [IU]/L (ref 0–40)
Albumin: 4.3 g/dL (ref 3.9–4.9)
Alkaline Phosphatase: 83 [IU]/L (ref 44–121)
BUN/Creatinine Ratio: 20 (ref 10–24)
BUN: 23 mg/dL (ref 8–27)
Bilirubin Total: 0.6 mg/dL (ref 0.0–1.2)
CO2: 24 mmol/L (ref 20–29)
Calcium: 9.4 mg/dL (ref 8.6–10.2)
Chloride: 99 mmol/L (ref 96–106)
Creatinine, Ser: 1.13 mg/dL (ref 0.76–1.27)
Globulin, Total: 2.3 g/dL (ref 1.5–4.5)
Glucose: 88 mg/dL (ref 70–99)
Potassium: 4.9 mmol/L (ref 3.5–5.2)
Sodium: 136 mmol/L (ref 134–144)
Total Protein: 6.6 g/dL (ref 6.0–8.5)
eGFR: 70 mL/min/{1.73_m2} (ref >59)

## 2023-11-27 LAB — LIPID PANEL
Chol/HDL Ratio: 3 {ratio} (ref 0.0–5.0)
Cholesterol, Total: 123 mg/dL (ref 100–199)
HDL: 41 mg/dL (ref >39)
LDL Chol Calc (NIH): 61 mg/dL (ref 0–99)
Triglycerides: 112 mg/dL (ref 0–149)
VLDL Cholesterol Cal: 21 mg/dL (ref 5–40)

## 2023-11-27 LAB — HEMOGLOBIN A1C
Est. average glucose Bld gHb Est-mCnc: 123 mg/dL
Hgb A1c MFr Bld: 5.9 % — ABNORMAL HIGH (ref 4.8–5.6)

## 2023-11-27 LAB — PSA: Prostate Specific Ag, Serum: 1.9 ng/mL (ref 0.0–4.0)

## 2023-11-30 DIAGNOSIS — Z79899 Other long term (current) drug therapy: Secondary | ICD-10-CM | POA: Diagnosis not present

## 2023-11-30 DIAGNOSIS — M7071 Other bursitis of hip, right hip: Secondary | ICD-10-CM | POA: Diagnosis not present

## 2023-11-30 DIAGNOSIS — M5416 Radiculopathy, lumbar region: Secondary | ICD-10-CM | POA: Diagnosis not present

## 2023-11-30 DIAGNOSIS — G894 Chronic pain syndrome: Secondary | ICD-10-CM | POA: Diagnosis not present

## 2023-12-15 DIAGNOSIS — M1712 Unilateral primary osteoarthritis, left knee: Secondary | ICD-10-CM | POA: Diagnosis not present

## 2024-02-05 ENCOUNTER — Ambulatory Visit: Payer: Self-pay

## 2024-02-05 DIAGNOSIS — I10 Essential (primary) hypertension: Secondary | ICD-10-CM | POA: Diagnosis not present

## 2024-02-05 DIAGNOSIS — K37 Unspecified appendicitis: Secondary | ICD-10-CM | POA: Diagnosis not present

## 2024-02-05 DIAGNOSIS — R1031 Right lower quadrant pain: Secondary | ICD-10-CM | POA: Diagnosis not present

## 2024-02-05 DIAGNOSIS — Z87891 Personal history of nicotine dependence: Secondary | ICD-10-CM | POA: Diagnosis not present

## 2024-02-05 DIAGNOSIS — Z882 Allergy status to sulfonamides status: Secondary | ICD-10-CM | POA: Diagnosis not present

## 2024-02-05 DIAGNOSIS — R509 Fever, unspecified: Secondary | ICD-10-CM | POA: Diagnosis not present

## 2024-02-05 DIAGNOSIS — E785 Hyperlipidemia, unspecified: Secondary | ICD-10-CM | POA: Diagnosis not present

## 2024-02-05 DIAGNOSIS — Z886 Allergy status to analgesic agent status: Secondary | ICD-10-CM | POA: Diagnosis not present

## 2024-02-05 DIAGNOSIS — K219 Gastro-esophageal reflux disease without esophagitis: Secondary | ICD-10-CM | POA: Diagnosis not present

## 2024-02-05 DIAGNOSIS — Z79899 Other long term (current) drug therapy: Secondary | ICD-10-CM | POA: Diagnosis not present

## 2024-02-05 DIAGNOSIS — K529 Noninfective gastroenteritis and colitis, unspecified: Secondary | ICD-10-CM | POA: Diagnosis not present

## 2024-02-05 DIAGNOSIS — Z888 Allergy status to other drugs, medicaments and biological substances status: Secondary | ICD-10-CM | POA: Diagnosis not present

## 2024-02-05 NOTE — Telephone Encounter (Signed)
 Copied from CRM [id number]. Topic: Clinical - Red Word Triage >> Feb 05, 2024  4:44 PM [place] La N wrote: [facility] that prompted transfer to Nurse Triage: Patient stated that above and below his belly button it hurts and he stated that he has a fever of 99.6 and he is wanting to get in to see a dr   Chief Complaint: Abdominal pain  Symptoms: Right sided abdominal pain, 99.6 F temperature  Frequency: Constant  Pertinent Negatives: Patient denies vomiting Disposition: [] ED /[x] Urgent Care (no appt availability in office) / [] Appointment(In office/virtual)/ []  [facility]/ [] Home Care/ [] Refused Recommended Disposition /[] [facility]/ []  Follow-up with PCP Additional Notes: Patient called to report mild to moderate pain in his right sided abdomen. He states his pain has been constant and is worse with movement. He states he also has an elevated temperature of 99.6 F. He denies any vomiting. Patient advised that with his symptoms he should go to urgent care or the ED for evaluation. Patient verbalized understanding and agreement with this plan.     Reason for Disposition  [1] MILD-MODERATE pain AND [2] constant AND [3] present > 2 hours  Answer Assessment - Initial Assessment Questions 1. LOCATION: "Where does it hurt?"      Right sided abdomen  2. RADIATION: "Does the pain shoot anywhere else?" (e.g., chest, back)     No radiation  3. ONSET: "When did the pain begin?" (Minutes, hours or days ago)      Today  4. SUDDEN: "Gradual or sudden onset?"     Present upon waking up 5. PATTERN "Does the pain come and go, or is it constant?"    - If it comes and goes: "How long does it last?" "Do you have pain now?"     (Note: Comes and goes means the pain is intermittent. It goes away completely between bouts.)    - If constant: "Is it getting better, staying the same, or getting worse?"      (Note: Constant means the pain never goes away completely; most serious pain is constant  and gets worse.)      Constant  6. SEVERITY: "How bad is the pain?"  (e.g., Scale 1-10; mild, moderate, or severe)    - MILD (1-3): Doesn't interfere with normal activities, abdomen soft and not tender to touch.     - MODERATE (4-7): Interferes with normal activities or awakens from sleep, abdomen tender to touch.     - SEVERE (8-10): Excruciating pain, doubled over, unable to do any normal activities.       4/10 7. RECURRENT SYMPTOM: "Have you ever had this type of stomach pain before?" If Yes, ask: "When was the last time?" and "What happened that time?"      NO 8. CAUSE: "What do you think is causing the stomach pain?"     Unsure  9. RELIEVING/AGGRAVATING FACTORS: "What makes it better or worse?" (e.g., antacids, bending or twisting motion, bowel movement)     No 10. OTHER SYMPTOMS: "Do you have any other symptoms?" (e.g., back pain, diarrhea, fever, urination pain, vomiting)       99.10F temperature  Protocols used: Abdominal Pain - Male-A-AH

## 2024-02-08 NOTE — Telephone Encounter (Signed)
Noted  Pt will go to UC.  KP 

## 2024-02-17 DIAGNOSIS — I350 Nonrheumatic aortic (valve) stenosis: Secondary | ICD-10-CM | POA: Diagnosis not present

## 2024-02-18 DIAGNOSIS — G894 Chronic pain syndrome: Secondary | ICD-10-CM | POA: Diagnosis not present

## 2024-02-18 DIAGNOSIS — M791 Myalgia, unspecified site: Secondary | ICD-10-CM | POA: Diagnosis not present

## 2024-02-18 DIAGNOSIS — M7071 Other bursitis of hip, right hip: Secondary | ICD-10-CM | POA: Diagnosis not present

## 2024-02-18 DIAGNOSIS — M5416 Radiculopathy, lumbar region: Secondary | ICD-10-CM | POA: Diagnosis not present

## 2024-05-11 DIAGNOSIS — M1712 Unilateral primary osteoarthritis, left knee: Secondary | ICD-10-CM | POA: Diagnosis not present

## 2024-05-20 ENCOUNTER — Ambulatory Visit: Payer: BC Managed Care – PPO | Admitting: Internal Medicine

## 2024-05-20 DIAGNOSIS — M7071 Other bursitis of hip, right hip: Secondary | ICD-10-CM | POA: Diagnosis not present

## 2024-05-20 DIAGNOSIS — G894 Chronic pain syndrome: Secondary | ICD-10-CM | POA: Diagnosis not present

## 2024-05-20 DIAGNOSIS — M5416 Radiculopathy, lumbar region: Secondary | ICD-10-CM | POA: Diagnosis not present

## 2024-05-20 DIAGNOSIS — Z79899 Other long term (current) drug therapy: Secondary | ICD-10-CM | POA: Diagnosis not present

## 2024-05-27 ENCOUNTER — Encounter: Payer: Self-pay | Admitting: Internal Medicine

## 2024-05-27 ENCOUNTER — Ambulatory Visit: Admitting: Internal Medicine

## 2024-05-27 VITALS — BP 126/78 | HR 64 | Ht 70.0 in | Wt 226.0 lb

## 2024-05-27 DIAGNOSIS — I10 Essential (primary) hypertension: Secondary | ICD-10-CM

## 2024-05-27 DIAGNOSIS — M1712 Unilateral primary osteoarthritis, left knee: Secondary | ICD-10-CM | POA: Diagnosis not present

## 2024-05-27 DIAGNOSIS — F411 Generalized anxiety disorder: Secondary | ICD-10-CM | POA: Diagnosis not present

## 2024-05-27 NOTE — Assessment & Plan Note (Signed)
 Doing well on Cymbalta  60 mg. He continues to work and enjoys it.

## 2024-05-27 NOTE — Assessment & Plan Note (Signed)
 Blood pressure is well controlled on lisinopril  and amlodipine . No medication side effects noted. Plan to continue current medications.

## 2024-05-27 NOTE — Progress Notes (Signed)
 Date:  05/27/2024   Name:  Chad Choi   DOB:  03-Nov-1955   MRN:  [id number]   Chief Complaint: Hypertension and Depression  Hypertension This is a chronic problem. The problem is controlled. Pertinent negatives include no chest pain, headaches, palpitations or shortness of breath. Past treatments include calcium  channel blockers and ACE inhibitors. The current treatment provides significant improvement. There is no history of kidney disease, CAD/MI or CVA.  Depression        This is a chronic problem.The problem is unchanged.  Associated symptoms include no fatigue and no headaches.  Past treatments include SNRIs - Serotonin and norepinephrine reuptake inhibitors.  Compliance with treatment is good.   Review of Systems  Constitutional:  Negative for fatigue and unexpected weight change.  HENT:  Negative for nosebleeds.   Eyes:  Negative for visual disturbance.  Respiratory:  Negative for cough, chest tightness, shortness of breath and wheezing.   Cardiovascular:  Negative for chest pain, palpitations and leg swelling.  Gastrointestinal:  Negative for abdominal pain, constipation and diarrhea.  Musculoskeletal:  Positive for arthralgias (left knee pain).  Neurological:  Negative for dizziness, weakness, light-headedness and headaches.  Psychiatric/Behavioral:  Positive for depression.      Lab Results  Component Value Date   NA 136 11/26/2023   K 4.9 11/26/2023   CO2 24 11/26/2023   GLUCOSE 88 11/26/2023   BUN 23 11/26/2023   CREATININE 1.13 11/26/2023   CALCIUM  9.4 11/26/2023   EGFR 70 11/26/2023   GFRNONAA 90 11/28/2020   Lab Results  Component Value Date   CHOL 123 11/26/2023   HDL 41 11/26/2023   LDLCALC 61 11/26/2023   TRIG 112 11/26/2023   CHOLHDL 3.0 11/26/2023   Lab Results  Component Value Date   TSH 0.687 12/03/2021   Lab Results  Component Value Date   HGBA1C 5.9 (H) 11/26/2023   Lab Results  Component Value Date   WBC 6.4 11/26/2023   HGB  14.5 11/26/2023   HCT 43.3 11/26/2023   MCV 85 11/26/2023   PLT 302 11/26/2023   Lab Results  Component Value Date   ALT 27 11/26/2023   AST 23 11/26/2023   ALKPHOS 83 11/26/2023   BILITOT 0.6 11/26/2023   Lab Results  Component Value Date   VD25OH 38.7 12/03/2021     Patient Active Problem List   Diagnosis Date Noted   Primary osteoarthritis of left knee 11/26/2023   BMI 36.0-36.9,adult 05/20/2023   Prediabetes 12/04/2021   Thoracic aortic aneurysm without rupture (HCC) 12/03/2021   Gynecomastia, male 12/03/2021   Right rotator cuff tear arthropathy 11/02/2021   Full thickness rotator cuff tear 09/19/2021   Generalized anxiety disorder 05/31/2021   Lateral epicondylitis of elbow 11/28/2020   Calculus of kidney 11/28/2020   Insomnia disorder 11/28/2020   Gastroesophageal reflux disease 11/28/2020   Coronary artery disease of native artery of native heart with stable angina pectoris (HCC) 01/29/2020   Migraine without aura and without status migrainosus, not intractable 08/08/2019   Aortic stenosis 05/25/2019   Vitamin D deficiency 03/09/2019   History of atrial fibrillation 03/09/2019   Obesity, morbid (HCC) 09/07/2018   History of arthroplasty of right ankle 07/05/2018   Degeneration of intervertebral disc of cervical region 04/20/2018   Mixed hyperlipidemia 04/20/2018   Essential hypertension 04/20/2018   Hx of adenomatous colonic polyps 04/20/2018   OSA on CPAP 04/20/2018   Primary osteoarthritis of both hands 06/06/2016   Other age-related incipient cataract, bilateral  05/13/2016    Allergies  Allergen Reactions   Naproxen Hives   Sulfa Antibiotics Hives and Other (See Comments)    Past Surgical History:  Procedure Laterality Date   TOTAL ANKLE REPLACEMENT Right 03/2018    Social History   Tobacco Use   Smoking status: Former    Current packs/day: 0.00    Average packs/day: 1.5 packs/day for 16.0 years (24.0 ttl pk-yrs)    Types: Cigarettes     Start date: 03/28/1971    Quit date: 03/28/1987    Years since quitting: 37.1   Smokeless tobacco: Former    Quit date: 1989  Substance Use Topics   Alcohol use: Never   Drug use: Never     Medication list has been reviewed and updated.  Current Meds  Medication Sig   amLODipine  (NORVASC ) 5 MG tablet TAKE 1 TABLET BY MOUTH EVERY DAY   atorvastatin  (LIPITOR) 40 MG tablet Take 40 mg by mouth daily.   Cholecalciferol  25 MCG (1000 UT) tablet Take 2 tablets by mouth daily.   cyanocobalamin  1000 MCG tablet Take 1 tablet by mouth daily.   cyclobenzaprine (FLEXERIL) 10 MG tablet TAKE 1 TABLET NIGHTLY AS NEEDED FOR MUSCLE SPAMS   DULoxetine  (CYMBALTA ) 60 MG capsule TAKE 1 CAPSULE BY MOUTH EVERY DAY   eplerenone (INSPRA) 25 MG tablet Take 25 mg by mouth daily.   gabapentin  (NEURONTIN ) 300 MG capsule Take 300 mg by mouth at bedtime.   lisinopril  (ZESTRIL ) 40 MG tablet Use as directed 40 mg in the mouth or throat daily.   NON FORMULARY CPAP nightly   oxyCODONE  (OXY IR/ROXICODONE ) 5 MG immediate release tablet Take 5 mg by mouth every 6 (six) hours.       05/27/2024    2:49 PM 11/26/2023    8:08 AM 05/20/2023    8:09 AM 12/04/2022    8:37 AM  GAD 7 : Generalized Anxiety Score  Nervous, Anxious, on Edge 0 0 0 0  Control/stop worrying 0 0 0 0  Worry too much - different things 0 0 0 0  Trouble relaxing 0 0 0 0  Restless 0 0 0 0  Easily annoyed or irritable 0 0 0 0  Afraid - awful might happen 0 0 0 0  Total GAD 7 Score 0 0 0 0  Anxiety Difficulty Not difficult at all Not difficult at all Not difficult at all Not difficult at all       05/27/2024    2:49 PM 11/26/2023    8:08 AM 05/20/2023    8:09 AM  Depression screen PHQ 2/9  Decreased Interest 0 0 0  Down, Depressed, Hopeless 0 0 0  PHQ - 2 Score 0 0 0  Altered sleeping 2  0  Tired, decreased energy 2  3  Change in appetite 0  0  Feeling bad or failure about yourself  0  0  Trouble concentrating 0  0  Moving slowly or  fidgety/restless 0  1  Suicidal thoughts 0  0  PHQ-9 Score 4  4  Difficult doing work/chores Not difficult at all  Not difficult at all    BP Readings from Last 3 Encounters:  05/27/24 126/78  11/26/23 102/66  05/20/23 134/78    Physical Exam Vitals and nursing note reviewed.  Constitutional:      General: He is not in acute distress.    Appearance: Normal appearance. He is well-developed.  HENT:     Head: Normocephalic and atraumatic.  Neck:  Vascular: No carotid bruit.  Cardiovascular:     Rate and Rhythm: Normal rate and regular rhythm.     Heart sounds: Murmur heard.  Pulmonary:     Effort: Pulmonary effort is normal. No respiratory distress.     Breath sounds: No wheezing or rhonchi.  Musculoskeletal:     Cervical back: Normal range of motion.     Right lower leg: No edema.     Left lower leg: No edema.  Lymphadenopathy:     Cervical: No cervical adenopathy.  Skin:    General: Skin is warm and dry.     Findings: No rash.  Neurological:     Mental Status: He is alert and oriented to person, place, and time.  Psychiatric:        Mood and Affect: Mood normal.        Behavior: Behavior normal.     Wt Readings from Last 3 Encounters:  05/27/24 226 lb (102.5 kg)  11/26/23 237 lb (107.5 kg)  05/20/23 243 lb (110.2 kg)    BP 126/78   Pulse 64   Ht 5' 10 (1.778 m)   Wt 226 lb (102.5 kg)   SpO2 99%   BMI 32.43 kg/m   Assessment and Plan:  Problem List Items Addressed This Visit       Unprioritized   Essential hypertension - Primary (Chronic)   Blood pressure is well controlled on lisinopril  and amlodipine   No medication side effects noted. Plan to continue current medications.       Generalized anxiety disorder (Chronic)   Doing well on Cymbalta  60 mg. He continues to work and enjoys it.      Primary osteoarthritis of left knee   Recently worsening pain with ambulation. He had a steroid injection last week that has helped a bit.      He  plans to use the [facility] for his PCP after my retirement and continue to see Cardiology in [place].  No follow-ups on file.    Leita HILARIO Adie, MD [facility] [place]

## 2024-05-27 NOTE — Assessment & Plan Note (Signed)
 Recently worsening pain with ambulation. He had a steroid injection last week that has helped a bit.

## 2024-06-20 DIAGNOSIS — H6123 Impacted cerumen, bilateral: Secondary | ICD-10-CM | POA: Diagnosis not present

## 2024-07-07 DIAGNOSIS — H2513 Age-related nuclear cataract, bilateral: Secondary | ICD-10-CM | POA: Diagnosis not present

## 2024-07-07 DIAGNOSIS — H526 Other disorders of refraction: Secondary | ICD-10-CM | POA: Diagnosis not present

## 2024-07-15 DIAGNOSIS — Z79899 Other long term (current) drug therapy: Secondary | ICD-10-CM | POA: Diagnosis not present

## 2024-07-15 DIAGNOSIS — M7071 Other bursitis of hip, right hip: Secondary | ICD-10-CM | POA: Diagnosis not present

## 2024-08-12 DIAGNOSIS — I350 Nonrheumatic aortic (valve) stenosis: Secondary | ICD-10-CM | POA: Diagnosis not present

## 2024-08-12 DIAGNOSIS — I25118 Atherosclerotic heart disease of native coronary artery with other forms of angina pectoris: Secondary | ICD-10-CM | POA: Diagnosis not present

## 2024-08-12 DIAGNOSIS — I10 Essential (primary) hypertension: Secondary | ICD-10-CM | POA: Diagnosis not present

## 2024-09-16 DIAGNOSIS — Z79899 Other long term (current) drug therapy: Secondary | ICD-10-CM | POA: Diagnosis not present

## 2024-09-16 DIAGNOSIS — M5416 Radiculopathy, lumbar region: Secondary | ICD-10-CM | POA: Diagnosis not present

## 2024-09-16 DIAGNOSIS — G894 Chronic pain syndrome: Secondary | ICD-10-CM | POA: Diagnosis not present

## 2024-09-16 DIAGNOSIS — M7071 Other bursitis of hip, right hip: Secondary | ICD-10-CM | POA: Diagnosis not present

## 2024-11-07 ENCOUNTER — Encounter: Admitting: Physician Assistant

## 2024-11-14 ENCOUNTER — Encounter: Admitting: Physician Assistant
